# Patient Record
Sex: Female | Born: 1965 | Race: White | Hispanic: No | Marital: Married | Smoking: Never smoker
Health system: Southern US, Community
[De-identification: ages and names within clinical notes are randomized; demographics above are authoritative.]

## PROBLEM LIST (undated history)

## (undated) DIAGNOSIS — B019 Varicella without complication: Secondary | ICD-10-CM

## (undated) HISTORY — DX: Varicella without complication: B01.9

---

## 1998-05-30 ENCOUNTER — Other Ambulatory Visit: Admission: RE | Admit: 1998-05-30 | Discharge: 1998-05-30 | Payer: Self-pay | Admitting: Internal Medicine

## 1999-04-23 ENCOUNTER — Encounter (INDEPENDENT_AMBULATORY_CARE_PROVIDER_SITE_OTHER): Payer: Self-pay | Admitting: Internal Medicine

## 2001-08-27 ENCOUNTER — Other Ambulatory Visit: Admission: RE | Admit: 2001-08-27 | Discharge: 2001-08-27 | Payer: Self-pay | Admitting: Obstetrics and Gynecology

## 2002-05-05 ENCOUNTER — Other Ambulatory Visit: Admission: RE | Admit: 2002-05-05 | Discharge: 2002-05-05 | Payer: Self-pay | Admitting: Obstetrics and Gynecology

## 2002-12-04 ENCOUNTER — Inpatient Hospital Stay (HOSPITAL_COMMUNITY): Admission: AD | Admit: 2002-12-04 | Discharge: 2002-12-08 | Payer: Self-pay | Admitting: Obstetrics and Gynecology

## 2003-01-26 ENCOUNTER — Other Ambulatory Visit: Admission: RE | Admit: 2003-01-26 | Discharge: 2003-01-26 | Payer: Self-pay | Admitting: Obstetrics and Gynecology

## 2004-02-10 ENCOUNTER — Other Ambulatory Visit: Admission: RE | Admit: 2004-02-10 | Discharge: 2004-02-10 | Payer: Self-pay | Admitting: Obstetrics and Gynecology

## 2004-10-09 ENCOUNTER — Ambulatory Visit: Payer: Self-pay | Admitting: Family Medicine

## 2004-12-06 ENCOUNTER — Ambulatory Visit: Payer: Self-pay | Admitting: Family Medicine

## 2005-07-19 ENCOUNTER — Encounter (INDEPENDENT_AMBULATORY_CARE_PROVIDER_SITE_OTHER): Payer: Self-pay | Admitting: Specialist

## 2005-07-19 ENCOUNTER — Ambulatory Visit: Payer: Self-pay | Admitting: Family Medicine

## 2005-07-19 ENCOUNTER — Inpatient Hospital Stay (HOSPITAL_COMMUNITY): Admission: RE | Admit: 2005-07-19 | Discharge: 2005-07-22 | Payer: Self-pay | Admitting: *Deleted

## 2005-09-30 ENCOUNTER — Ambulatory Visit: Payer: Self-pay | Admitting: Family Medicine

## 2005-10-21 ENCOUNTER — Emergency Department: Payer: Self-pay | Admitting: Emergency Medicine

## 2006-12-23 ENCOUNTER — Ambulatory Visit: Payer: Self-pay | Admitting: Family Medicine

## 2008-02-16 ENCOUNTER — Ambulatory Visit: Payer: Self-pay | Admitting: Family Medicine

## 2008-02-16 ENCOUNTER — Encounter (INDEPENDENT_AMBULATORY_CARE_PROVIDER_SITE_OTHER): Payer: Self-pay | Admitting: Internal Medicine

## 2008-02-23 LAB — CONVERTED CEMR LAB
Hepatitis B-Post: 601 milliintl units/mL
Varicella IgG: 3.74 — ABNORMAL HIGH

## 2008-03-01 ENCOUNTER — Ambulatory Visit: Payer: Self-pay | Admitting: Family Medicine

## 2008-03-03 ENCOUNTER — Encounter (INDEPENDENT_AMBULATORY_CARE_PROVIDER_SITE_OTHER): Payer: Self-pay | Admitting: Internal Medicine

## 2008-03-10 ENCOUNTER — Encounter (INDEPENDENT_AMBULATORY_CARE_PROVIDER_SITE_OTHER): Payer: Self-pay | Admitting: Internal Medicine

## 2008-08-18 ENCOUNTER — Ambulatory Visit: Payer: Self-pay | Admitting: Family Medicine

## 2008-08-18 DIAGNOSIS — B369 Superficial mycosis, unspecified: Secondary | ICD-10-CM

## 2008-11-11 ENCOUNTER — Telehealth (INDEPENDENT_AMBULATORY_CARE_PROVIDER_SITE_OTHER): Payer: Self-pay | Admitting: Internal Medicine

## 2009-03-21 ENCOUNTER — Ambulatory Visit: Payer: Self-pay | Admitting: Family Medicine

## 2009-03-23 ENCOUNTER — Encounter (INDEPENDENT_AMBULATORY_CARE_PROVIDER_SITE_OTHER): Payer: Self-pay | Admitting: Internal Medicine

## 2009-05-23 ENCOUNTER — Ambulatory Visit: Payer: Self-pay | Admitting: Internal Medicine

## 2009-05-23 DIAGNOSIS — J069 Acute upper respiratory infection, unspecified: Secondary | ICD-10-CM | POA: Insufficient documentation

## 2009-10-18 ENCOUNTER — Telehealth: Payer: Self-pay | Admitting: Family Medicine

## 2009-11-16 ENCOUNTER — Ambulatory Visit: Payer: Self-pay | Admitting: Family Medicine

## 2009-11-16 DIAGNOSIS — T148XXA Other injury of unspecified body region, initial encounter: Secondary | ICD-10-CM | POA: Insufficient documentation

## 2010-10-09 NOTE — Assessment & Plan Note (Signed)
Summary: FELL AND PAIN IN KNEE/DLO   Vital Signs:  Patient profile:   45 year old female Height:      65 inches Weight:      140.4 pounds BMI:     23.45 Temp:     97.8 degrees F oral Pulse rate:   80 / minute Pulse rhythm:   regular BP sitting:   100 / 60  (left arm) Cuff size:   regular  Vitals Entered By: Benny Lennert CMA Duncan Dull) (November 16, 2009 12:07 PM)  History of Present Illness: Chief complaint fell and pain in left leg and knee  45 year old patient who presents for acute fall:  Coming down stairs in the middle of the night and turned and hit the bottom of her heel, left side of face and her side of leg.  Happened about two weeks ago, laid with her leg propped up. Did not really take anything and took some alleve. Hobbled aroud.   Stayed using the elevator and evertying.   Left side, continued bruising and pain, primarily laterally on the left side, and some in the posterior aspect of her thigh.  REVIEW OF SYSTEMS  GEN: No systemic complaints, no fevers, chills, sweats, or other acute illnesses MSK: Detailed in the HPI GI: tolerating PO intake without difficulty Neuro: No numbness, parasthesias, or tingling associated. Otherwise the pertinent positives of the ROS are noted above.    GEN: Well-developed,well-nourished,in no acute distress; alert,appropriate and cooperative throughout examination HEENT: Normocephalic and atraumatic without obvious abnormalities. No apparent alopecia or balding. Ears, externally no deformities PULM: Breathing comfortably in no respiratory distress EXT: No clubbing, cyanosis, or edema PSYCH: Normally interactive. Cooperative during the interview. Pleasant. Friendly and conversant. Not anxious or depressed appearing. Normal, full affect.   bilateral hips, full range of motion. Nontender.  Nontender the greater trochanteric bursa. Stable pelvis with manipulation.  Full range of motion at the knee and nontender grossly  bilaterally.  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  Consistent with hematoma, left lateral thigh, observation only is appropriate. Discussed hematoma and bruising with the patient.  Complete Medication List: 1)  Daily Multiple Vitamins Tabs (Multiple vitamin) .... Take 1 tablet by mouth once a day 2)  Calcium 600/vitamin D 600-400 Mg-unit Tabs (Calcium carbonate-vitamin d) .... Take 1 tablet by mouth once a day 3)  Eql Fish Oil 1000 Mg Caps (Omega-3 fatty acids) .... Take 1 capsule by mouth two times a day  Current Allergies (reviewed today): No known allergies

## 2010-10-09 NOTE — Progress Notes (Signed)
Summary: nausea and diarrhea  Phone Note Call from Patient Call back at [facility] Phone [phone]   Caller: Patient Summary of Call: Pt complains of vomiting and diarrhea, since this morning.  No fever.  Sxs are not severe.  Advised small sips of clear fluids, rest, call back tomorrow if not better. Initial call taken by: Lowella Petties CMA,  October 18, 2009 3:08 PM  Follow-up for Phone Call        Agreed. Follow-up by: Ruthe Mannan MD,  October 18, 2009 3:19 PM

## 2011-01-04 ENCOUNTER — Telehealth: Payer: Self-pay | Admitting: *Deleted

## 2011-01-04 NOTE — Telephone Encounter (Signed)
Pt states she has had vomiting and diarrhea for 2 days.  She is asking what she can do.  Advised to take frequent small sips of clear fluids, eat ice chips, avoid dairy products.  Avoid solid foods if vomiting.  Can try BRAT diet after vomiting stops, if diarrhea continues.  Rest, wash hands frequently.  She has no other symptoms- no fever or severe abd pain.

## 2011-01-04 NOTE — Telephone Encounter (Signed)
agree

## 2011-01-25 NOTE — Op Note (Signed)
[name], [name]                          ACCOUNT NO.:  [id number]   MEDICAL RECORD NO.:  [id number]                   PATIENT TYPE:  INP   LOCATION:  9171                                 FACILITY:  [facility]   PHYSICIAN:  [place] B. Earlene Plater, M.D.               DATE OF BIRTH:  1966/08/01   DATE OF PROCEDURE:  12/05/2002  DATE OF DISCHARGE:                                 OPERATIVE REPORT   PREOPERATIVE DIAGNOSES:  1. Unsuccessful trial of vacuum and failure to descend.  2. Occipitoposterior position.   POSTOPERATIVE DIAGNOSES:  1. Unsuccessful trial of vacuum and failure to descend.  2. Occipitoposterior position.   PROCEDURE:  Primary low transverse cesarean section.   SURGEON:  Chester Holstein. Earlene Plater, M.D.   ANESTHESIA:  Epidural.   FINDINGS:  Viable female infant, right occipitoposterior position.  Apgars 9  and 9.  Normal uterus, tubes and ovaries.  Subsequently developed uterine  atony, treated with Hemabate x, Methergine x1 and 40 milliunits of Pitocin  infused through the IV.  The infant's weight was 7 pounds 10 ounces.   ESTIMATED BLOOD LOSS:  1000 mL.   FLUIDS:  2200 mL.   URINE OUTPUT:  50 mL.   DRAINS:  Foley.   INDICATIONS:  The patient presented with spontaneous rupture of membranes at  term.  She was contracting spontaneously but did not labor on her own.  She  was subsequently augmented with Pitocin and did progress to complete.  She  pushed for just greater than three hours and would not descend beyond +3  station.  A trial of vacuum was unsuccessful, and the patient subsequently  presents for cesarean section.   The vacuum used was the [facility].  It was placed on the flexion point just  anterior to the posterior fontanel in the mid green zone for three pulls.  There was no descent, and there were no pop offs.  I therefore recommended  cesarean section.   DESCRIPTION OF PROCEDURE:  The patient was taken to the operating room with  epidural anesthesia in place.   She was prepped and draped in the standard  fashion.  The bladder was being drained with the Foley catheter.   A Pfannenstiel incision was made with a knife and carried sharply to the  underlying fascia.  The fascia was divided in the midline and extended  bilaterally with the Mayo scissors.  Kocher clamps were used to elevate the  superior aspect of the incision and the underlying rectus muscles were  dissected off sharply superiorly and inferiorly in a similar fashion.  The  midline of the rectus muscles was identified and posterior sheath elevated  and entered sharply.   The bladder blade was inserted.  The vesicouterine peritoneum was  identified, elevated and bladder flap created with sharp and blunt  technique.  The bladder blade was reinserted and the uterine incision made  in a low  transverse fashion with a knife.  Clear fluid noted at amniotomy.   The infant's head was delivered up through the incision.  Nose and mouth  were suctioned with the bulb, and the remainder of the infant delivered.  It  was necessary to dislodge the anterior arm prior to delivery of the  remainder of the infant much like the shoulder dystocia at a vaginal  delivery.   The cord was clamped and cut, and the infant handed off to the awaiting  pediatricians.  The placenta was removed manually.  The uterus was  exteriorized and cleared of all clots and debris.  The uterine incision was  inspected, and there was a slight extension at the left lateral aspect that  was well visualized.  The extent of the incision was identified and closed  in a running locked stitch of 0 Monocryl.  A second layer was placed in an  imbricating fashion with the same suture.  Bleeders were noted at each  corner and made hemostatic with interrupted figure-of-eight sutures of 0  Monocryl.   Moderate uterine atony was encountered.  This was treated with uterine  massage, 40 milliunits of Pitocin by rapid infusion IV, two doses  of  intrauterine Hemabate and one dose of intramuscular Methergine.  After this,  the uterine tone was improved, and being firm enough by me to leave the  operating room.  The patient will be treated with Methergine p.o. for 24  hours after surgery.   The patient tolerated the procedure well and other than postpartum uterine  atony, there were no complications.  She was taken to the operating room in  stable condition.  All counts were correct per the operating room staff.                                               Gerri Spore B. Earlene Plater, M.D.    WBD/MEDQ  D:  12/05/2002  T:  12/05/2002  Job:  [id number]

## 2011-01-25 NOTE — Op Note (Signed)
NAME:  [name], [name]              ACCOUNT NO.:  [id number]   MEDICAL RECORD NO.:  [id number]          PATIENT TYPE:  INP   LOCATION:  9372                          FACILITY:  [facility]   PHYSICIAN:  [place] B. Earlene Plater, M.D.  DATE OF BIRTH:  03-11-1966   DATE OF PROCEDURE:  07/19/2005  DATE OF DISCHARGE:                                 OPERATIVE REPORT   PREOPERATIVE DIAGNOSES:  1.  Thirty-seven week intrauterine pregnancy.  2.  Breech presentation.  3.  Mild preeclampsia.  4.  Previous cesarean section.   POSTOPERATIVE DIAGNOSES:  1.  Thirty-seven week intrauterine pregnancy.  2.  Breech presentation.  3.  Mild preeclampsia.  4.  Previous cesarean section.  5.  Placenta accreta.   PROCEDURE:  Repeat low transverse cesarean section with manual extraction  and curettage of placenta accreta and B-Lynch suturing due to uterine atony.   SURGEON:  Chester Holstein. Earlene Plater, M.D.   ASSISTANT:  Shelbie Proctor. Shawnie Pons, M.D.   ANESTHESIA:  Spinal.   SPECIMENS:  Placenta.   ESTIMATED BLOOD LOSS:  3000.   FLUIDS:  5 L crystalloid and two units of packed red cells.   COMPLICATIONS:  Placenta accreta, which was removed manually and with  curettage.  B-Lynch sutures were placed to recompress the uterus and reduce  bleeding.   INDICATIONS:  Patient with above issues with 2 g of protein on 24-hour urine  from yesterday.  Blood pressure has been 140s/90s, and she had been  asymptomatic other than abrupt increase in swelling in the last few days.  Given her gestational age, breech presentation and previous C-section, I  recommended repeat C-section at this point.  Ultrasound had given no  indication for abnormal placentation, and her prenatal care had been  otherwise uncomplicated.   PROCEDURE:  Patient taken to the operating room and spinal anesthesia  obtained.  She was prepped and draped in standard fashion and a Foley  catheter inserted into the bladder.  A Pfannenstiel made with a knife and  carried  sharply to the fascia.  The fascia was divided sharply and the  underlying rectus muscles were dissected off sharply.  The posterior sheath  and peritoneum were entered sharply, bladder flap created with sharp and  blunt technique.  A few subserosal vessels were noted in the fundal region,  although otherwise the fundus appeared normal as did the area of the bladder  flap.   The bladder flap was created with sharp and blunt technique, the bladder  blade inserted, and the uterine incision made in a low transverse fashion  with a knife.  Clear fluid at amniotomy.   The breech was elevated through the incision and delivered sacrum anterior.  The legs were delivered by flexion of the hips.  Traction placed on the  iliac crests and delivered to the level of the scapulae.  Each arm was  delivered by rotation of the torso and flexion at the elbow.  Head delivered  by flexion of the neck, nose and mouth suctioned with a bulb, cord clamped  and cut, and infant handed off to awaiting pediatricians.  Ancef 1 g given  at cord clamp.   Attempt was made to remove the placenta by uterine massage; however, no  success with this approach.  Manual extraction attempted and abnormal  attachment to the posterior wall and fundus of the uterus was encountered.  Placenta accreta diagnosed.  Additional anesthesia support and a surgical  assist requested and immediately obtained.   With additional effort, the placenta could be removed in pieces with the  assistance of the ring forceps and ultimately of the banjo curette.  Significant bleeding occured during this time from the placental area.  It  appeared that all of the placental tissue was removed.  At the fundus the  uterine thickness was essentially paper thin.  The uterus was also atonic  despite massage and intravenous Pitocin in the IV bag.  Therefore, I decided  to place B-Lynch sutures to compress the uterus and reduce bleeding.  This  had a  remarkable effect.  The bleeding was essentially stopped with  placement of several B-Lynch sutures of 0 chromic.  The uterine incision was  closed in a running locked stitch of 0 chromic and a second imbricating  layer placed for hemostasis.  It was noted at this point that all suture  points were oozing.  We had already begun giving packed cells and sent a DIC  panel and requested FFP and platelets, which were being processed at the  time.  Avitene was placed over the bleeding suture points with hemostasis  obtained.  The drape was lifted and the patient froglegged, the uterus  compressed and some clot expressed but no additional bleeding found.  Therefore, the uterus was reinserted and the fascia closed in a running  stitch of 0 Vicryl.  The subcutaneous tissue was irrigated and made  hemostatic with the Bovie and the skin closed with staples.  The patient was  again froglegged and no additional substantial bleeding was encountered with  uterine massage.  The fundus was noted to be at least 2 cm below the  umbilicus at this point.  In addition, the patient was stable and therefore  deemed appropriate to take to the recovery room.  It was explained to the  patient intraoperatively  real-time as she was awake the ongoing situation  and the potential need for hysterectomy, which appears to be averted by  placement of the B-Lynch sutures; however, we will have to continue to  monitor her closely for bleeding.  In addition, she will need FFP, which has  been ordered and will be administered upon arrival.   The patient tolerated the procedure well and otherwise there were no  complications.  She was taken to the recovery room in stable condition.  All  counts correct per the operating room staff.      Gerri Spore B. Earlene Plater, M.D.  Electronically Signed     WBD/MEDQ  D:  07/19/2005  T:  07/20/2005  Job:  [id number]

## 2011-01-25 NOTE — Discharge Summary (Signed)
NAME:  [name], [name]              ACCOUNT NO.:  [id number]   MEDICAL RECORD NO.:  [id number]          PATIENT TYPE:  INP   LOCATION:  9120                          FACILITY:  [facility]   PHYSICIAN:  [place] B. Earlene Plater, M.D.  DATE OF BIRTH:  Oct 04, 1965   DATE OF ADMISSION:  07/19/2005  DATE OF DISCHARGE:  07/22/2005                                 DISCHARGE SUMMARY   ADMITTING DIAGNOSES:  1.  A 37-week intrauterine pregnancy.  2.  Breech presentation.  3.  Mild preeclampsia.  4.  Previous cesarean section.   DISCHARGE DIAGNOSES:  1.  A 37-week intrauterine pregnancy.  2.  Breech presentation.  3.  Mild preeclampsia.  4.  Previous cesarean section.  5.  Placenta accreta.   PROCEDURE:  Repeat low transverse cesarean section with manual extraction  and curettage of placenta accreta and B-Lynch suturing due to uterine atony.   HISTORY OF PRESENT ILLNESS:  A 45 year old white female gravida 2, para 1  37+ weeks with 2 g of protein on a 24-hour urine with blood pressures in the  140s/90s with increasing extremity swelling.  Given her gestational age and  previous cesarean section I recommended a repeat.  Ultrasound had given no  indication for abnormal presentation.   HOSPITAL COURSE:  Patient was admitted and repeat low transverse cesarean  section performed without difficulty.  However, at attempt to remove the  placenta it was noted to be abnormally implanted consistent with placenta  accreta.  With additional effort ultimately placenta was removed manually  and with uterine curettage, however, with significant uterine atony and  bleeding as a result.  This was treated with B-Lynch suturing.  Given the  substantial blood loss patient did require 4 units of packed red blood cells  and 2 units of fresh frozen plasma as she did demonstrate clinical evidence  of DIC intraoperatively.   Postoperatively patient rapidly regained her ability to ambulate, void, and  tolerate a regular diet.   She remained hemodynamically stable and her  postoperative hemoglobin settled at about 9.  No additional blood products  were necessary.  She was discharged to home on the third postoperative day  in satisfactory condition.  It was emphasized to the patient the severe  nature of the bleeding which was encountered and the recommendation made  that no further children be attempted.  Patient had not been counseled for  tubal ligation preoperatively, therefore, it was not performed  intraoperatively as the main focus was preservation of life and cessation of  bleeding.  However, patient is considering her options in this regard for  future birth control.   DISCHARGE INSTRUCTIONS:  Standard pre-printed instructions given prior to  dismissal.   DISCHARGE MEDICATIONS:  1.  Tylox one to two p.o. q.4h. for significant pain.  2.  Ferrous sulfate 325 mg p.o. b.i.d.   FOLLOW-UP:  [facility] Dr. Earlene Plater one month.      Gerri Spore B. Earlene Plater, M.D.  Electronically Signed     WBD/MEDQ  D:  08/15/2005  T:  08/15/2005  Job:  [id number]

## 2011-01-25 NOTE — Discharge Summary (Signed)
[name], [name]                          ACCOUNT NO.:  [id number]   MEDICAL RECORD NO.:  [id number]                   PATIENT TYPE:  INP   LOCATION:  9128                                 [facility]ACILITY[facility]   PHYSICIAN:  [place] B. Earlene Plater, M.D.               DATE OF BIRTH:  [birth year]   DATE OF ADMISSION:  12/04/2002  DATE OF DISCHARGE:  12/08/2002                                 DISCHARGE SUMMARY   ADMISSION DIAGNOSIS:  1. Spontaneous ruptured membranes at term.  2. Failure to descend.  3. Unsuccessful trial of vacuum-assisted delivery.  4. Occipita posterior position.   PROCEDURES:  1. Admission for management of labor.  2. Attempted at vacuum-assisted delivery for failure to descend.  3. Primary low-transverse cesarean section for unsuccessful trial vacuum.   HOSPITAL COURSE:  For complete details please see the history and physical  in the chart.  Briefly the patient presented, a 45 year old white female  gravida 1, para 0, 43-3/7ths weeks with spontaneous ruptured membranes.  Pitocin augmentation was necessary.  Patient progressed to complete and  pushed for about three hours.  Was found to be complete  +3 right occipita  posterior position.  Was offered for a second time a trial with vacuum but  wanted to keep pushing.  Ultimately pushed for three hours and 50 minutes.  Vacuum attempt was unsuccessful as there was no descent on +3 with 3 pulse  without any pop-offs with the [facility] device.  Therefore the patient was  delivered by a primary cesarean section.   Findings at the time of surgery included viable female with Apgars 9 and 9.  Normal  uterus, tubes and ovaries.  Weight was 7 pounds 10 ounces.  Moderate  uterine atony was encountered.  This was managed with Methergine, Hemabate  and Pitocin.  The patient was also given a Cytotec suppository 400 mcg per  rectum in the PACU to maintain good uterine tone.  She was also given p.o.  Methergine for 24 hours for the same.   She was monitored overnight in the  AICU to closely follow her vital signs.  Her hemoglobin stabilized at 8.4  postpartum and the patient was without symptoms from the mild to moderate  anemia.   By the third postoperative day the patient was ambulating, voiding and  tolerating a regular diet.  She had no symptoms from the anemia and was  discharged home in satisfactory condition.   DISCHARGE MEDICATIONS:  1. Ferrous sulfate 325 mg p.o. daily.  2. Prenatal vitamins p.o. daily.  3. Tylox 1-2 tabs every four to six hours as needed for pain.    FOLLOW UP:  Follow up with [facility] in four to six weeks.   DISCHARGE INSTRUCTIONS:  Standard preprinted instructions were given prior  to dismissal.  Gerri Spore B. Earlene Plater, M.D.    WBD/MEDQ  D:  12/28/2002  T:  12/28/2002  Job:  [id number]

## 2011-10-29 ENCOUNTER — Encounter: Payer: Self-pay | Admitting: Family Medicine

## 2011-10-30 ENCOUNTER — Ambulatory Visit (INDEPENDENT_AMBULATORY_CARE_PROVIDER_SITE_OTHER): Payer: 59 | Admitting: Family Medicine

## 2011-10-30 ENCOUNTER — Encounter: Payer: Self-pay | Admitting: Family Medicine

## 2011-10-30 VITALS — BP 98/68 | HR 76 | Temp 97.7°F | Ht 65.0 in | Wt 142.2 lb

## 2011-10-30 DIAGNOSIS — J069 Acute upper respiratory infection, unspecified: Secondary | ICD-10-CM | POA: Insufficient documentation

## 2011-10-30 MED ORDER — GUAIFENESIN-CODEINE 100-10 MG/5ML PO SYRP: 5.0000 mL | ORAL_SOLUTION | Freq: Three times a day (TID) | ORAL | Status: AC | PRN

## 2011-10-30 MED ORDER — BENZONATATE 200 MG PO CAPS: 200.0000 mg | ORAL_CAPSULE | Freq: Three times a day (TID) | ORAL | Status: AC | PRN

## 2011-10-30 NOTE — Progress Notes (Signed)
  Subjective:    Patient ID: [name], female    DOB: 12-03-1965, 46 y.o.   MRN: [id number]  HPI Here for uri symptoms  Cough for at least a week -- started productive , now dry Was clear mucous Is lingering  (problematic - dental hygienist )  Is worse at night - keeps her up  Also some crust in her eyes when she wakes up (no redness or pain )  Post nasal drainage  Some nasal congestion Some sore throat Ears are fine   No fever   Patient Active Problem List  Diagnoses  . FUNGAL DERMATITIS  . CONTUSION OF UNSPECIFIED SITE  . Viral URI with cough   No past medical history on file. Past Surgical History  Procedure Date  . Cesarean section     x 2   History  Substance Use Topics  . Smoking status: Never Smoker   . Smokeless tobacco: Not on file  . Alcohol Use: Not on file   No family history on file. No Known Allergies Current Outpatient Prescriptions on File Prior to Visit  Medication Sig Dispense Refill  . Multiple Vitamin (MULTIVITAMIN) tablet Take 1 tablet by mouth daily.      . Omega-3 Fatty Acids (FISH OIL) 1000 MG CAPS Take by mouth 2 (two) times daily.      . Calcium Carbonate-Vitamin D (SM CALCIUM 600/VITAMIN D) 600-400 MG-UNIT per tablet Take 1 tablet by mouth daily.          Review of Systems Review of Systems  Constitutional: Negative for fever, appetite change, and unexpected weight change. pos for fatigue  Eyes: Negative for pain and visual disturbance.  ENT neg for sinus pain or epistaxis Respiratory: Negative for sob or wheeze  Cardiovascular: Negative for cp or palpitations    Gastrointestinal: Negative for nausea, diarrhea and constipation.  Genitourinary: Negative for urgency and frequency.  Skin: Negative for pallor or rash   Neurological: Negative for weakness, light-headedness, numbness and headaches.  Hematological: Negative for adenopathy. Does not bruise/bleed easily.  Psychiatric/Behavioral: Negative for dysphoric mood. The  patient is not nervous/anxious.          Objective:   Physical Exam  Constitutional: She appears well-developed and well-nourished. No distress.  HENT:  Head: Normocephalic and atraumatic.  Right Ear: External ear normal.  Left Ear: External ear normal.  Mouth/Throat: Oropharynx is clear and moist. No oropharyngeal exudate.       Nares are injected and congested  No sinus tenderness  Eyes: Conjunctivae and EOM are normal. Pupils are equal, round, and reactive to light. Right eye exhibits no discharge. Left eye exhibits no discharge.  Neck: Normal range of motion. Neck supple.  Cardiovascular: Normal rate and regular rhythm.   Pulmonary/Chest: Effort normal and breath sounds normal. No respiratory distress. She has no wheezes. She has no rales. She exhibits no tenderness.  Lymphadenopathy:    She has no cervical adenopathy.  Skin: Skin is warm and dry. No rash noted.  Psychiatric: She has a normal mood and affect.          Assessment & Plan:

## 2011-10-30 NOTE — Patient Instructions (Signed)
I think you have a viral upper resp infection with cough  Try the tessalon during the day and the robitussin with codeine for night  Drink lots of fluids and get extra rest Update if not starting to improve in a week or if worsening

## 2011-10-30 NOTE — Assessment & Plan Note (Signed)
Dry cough / clear mucous drainage and no fever Will tx symptoms Tessalon tid prn for day and robitussin codeine at night Adv fluids/ rest Update if not starting to improve in a week or if worsening

## 2011-11-27 ENCOUNTER — Ambulatory Visit: Payer: 59 | Admitting: Family Medicine

## 2012-01-09 ENCOUNTER — Ambulatory Visit (INDEPENDENT_AMBULATORY_CARE_PROVIDER_SITE_OTHER): Payer: 59 | Admitting: Family Medicine

## 2012-01-09 ENCOUNTER — Encounter: Payer: Self-pay | Admitting: Family Medicine

## 2012-01-09 VITALS — BP 100/62 | HR 80 | Temp 98.4°F | Ht 65.25 in | Wt 142.0 lb

## 2012-01-09 DIAGNOSIS — R5381 Other malaise: Secondary | ICD-10-CM

## 2012-01-09 DIAGNOSIS — Z0000 Encounter for general adult medical examination without abnormal findings: Secondary | ICD-10-CM

## 2012-01-09 DIAGNOSIS — R197 Diarrhea, unspecified: Secondary | ICD-10-CM

## 2012-01-09 DIAGNOSIS — R5383 Other fatigue: Secondary | ICD-10-CM

## 2012-01-09 DIAGNOSIS — Z136 Encounter for screening for cardiovascular disorders: Secondary | ICD-10-CM

## 2012-01-09 LAB — CBC WITH DIFFERENTIAL/PLATELET
Basophils Absolute: 0 10*3/uL (ref 0.0–0.1)
HCT: 38.3 % (ref 36.0–46.0)
Lymphs Abs: 1.6 10*3/uL (ref 0.7–4.0)
MCV: 91.5 fl (ref 78.0–100.0)
Monocytes Absolute: 0.3 10*3/uL (ref 0.1–1.0)
Neutrophils Relative %: 61.7 % (ref 43.0–77.0)
Platelets: 203 10*3/uL (ref 150.0–400.0)
RDW: 13 % (ref 11.5–14.6)

## 2012-01-09 LAB — LIPID PANEL
HDL: 48.9 mg/dL (ref >39.00)
Total CHOL/HDL Ratio: 4
Triglycerides: 112 mg/dL (ref 0.0–149.0)
VLDL: 22.4 mg/dL (ref 0.0–40.0)

## 2012-01-09 LAB — COMPREHENSIVE METABOLIC PANEL
ALT: 14 U/L (ref 0–35)
AST: 16 U/L (ref 0–37)
Alkaline Phosphatase: 43 U/L (ref 39–117)
Creatinine, Ser: 1 mg/dL (ref 0.4–1.2)
Total Bilirubin: 0.7 mg/dL (ref 0.3–1.2)

## 2012-01-09 LAB — T4, FREE: Free T4: 0.89 ng/dL (ref 0.60–1.60)

## 2012-01-09 NOTE — Progress Notes (Signed)
Subjective:    Patient ID: [name], female    DOB: 08-01-1966, 46 y.o.   MRN: [id number]  HPI  46 yo pt new to me here for CPX.  G2P2- no h/o abnormal pap smears, has GYN ([facility])- UTD on pap and mammogram.  Fatigue- feels like she is more tired lately.  Works full time and has two kids and manages to exercise and go to all of her kids activities.  Feels like her fatigue is "due to life" but wanted to make sure it was nothing more serious.  Sister has a h/o thyroid dysfunction. No CP or SOB. No blood in stool. No dizziness. No HA or blurred vision. Denies any symptoms of hypo or hyper thyroidism. Denies any symptoms of anxiety and or depression.  Diarrhea- past few months, has loose stools and mild abdominal cramping after every meal, worsened by spicy foods.  No abdominal pain other than mild cramping. No n/v. No fevers. No blood or mucous in stool. Does not seem to be worsened specifically by dairy foods.  Patient Active Problem List  Diagnoses  . FUNGAL DERMATITIS  . CONTUSION OF UNSPECIFIED SITE  . Viral URI with cough  . Routine general medical examination at a health care facility  . Fatigue  . Diarrhea   No past medical history on file. Past Surgical History  Procedure Date  . Cesarean section     x 2   History  Substance Use Topics  . Smoking status: Never Smoker   . Smokeless tobacco: Not on file  . Alcohol Use: Not on file   Family History  Problem Relation Age of Onset  . Thyroid disease Father    No Known Allergies Current Outpatient Prescriptions on File Prior to Visit  Medication Sig Dispense Refill  . Calcium Carbonate-Vitamin D (SM CALCIUM 600/VITAMIN D) 600-400 MG-UNIT per tablet Take 1 tablet by mouth daily.      . Multiple Vitamin (MULTIVITAMIN) tablet Take 1 tablet by mouth daily.      . Omega-3 Fatty Acids (FISH OIL) 1000 MG CAPS Take by mouth 2 (two) times daily.      Marland Kitchen Phenylephrine-DM-GG (TUSSIN CF PO) Take by mouth as  directed.       The PMH, PSH, Social History, Family History, Medications, and allergies have been reviewed in [facility], and have been updated if relevant.    The PMH, PSH, Social History, Family History, Medications, and allergies have been reviewed in [facility], and have been updated if relevant.   Review of Systems    See HPI Patient reports no  vision/ hearing changes,anorexia, weight change, fever ,adenopathy, persistant / recurrent hoarseness, swallowing issues, chest pain, edema,persistant / recurrent cough, hemoptysis, dyspnea(rest, exertional, paroxysmal nocturnal), gastrointestinal  bleeding (melena, rectal bleeding), abdominal pain, excessive heart burn, GU symptoms(dysuria, hematuria, pyuria, voiding/incontinence  Issues) syncope, focal weakness, severe memory loss, concerning skin lesions, depression, anxiety, abnormal bruising/bleeding, major joint swelling, breast masses or abnormal vaginal bleeding.    Objective:   Physical Exam BP 100/62  Pulse 80  Temp(Src) 98.4 F (36.9 C) (Oral)  Ht 5' 5.25" (1.657 m)  Wt 142 lb (64.411 kg)  BMI 23.45 kg/m2  LMP 12/23/2011  General:  Well-developed,well-nourished,in no acute distress; alert,appropriate and cooperative throughout examination Head:  normocephalic and atraumatic.   Eyes:  vision grossly intact, pupils equal, pupils round, and pupils reactive to light.   Ears:  R ear normal and L ear normal.   Nose:  no external deformity.  Mouth:  good dentition.   Neck:  No deformities, masses, or tenderness noted. Lungs:  Normal respiratory effort, chest expands symmetrically. Lungs are clear to auscultation, no crackles or wheezes. Heart:  Normal rate and regular rhythm. S1 and S2 normal without gallop, murmur, click, rub or other extra sounds. Abdomen:  Bowel sounds positive,abdomen soft and non-tender without masses, organomegaly or hernias noted. Msk:  No deformity or scoliosis noted of thoracic or lumbar spine.   Extremities:  No  clubbing, cyanosis, edema, or deformity noted with normal full range of motion of all joints.   Neurologic:  alert & oriented X3 and gait normal.   Skin:  Intact without suspicious lesions or rashes Psych:  Cognition and judgment appear intact. Alert and cooperative with normal attention span and concentration. No apparent delusions, illusions, hallucinations     Assessment & Plan:   1. Routine general medical examination at a health care facility  Reviewed preventive care protocols, scheduled due services, and updated immunizations Discussed nutrition, exercise, diet, and healthy lifestyle.  Comprehensive metabolic panel Lipid Panel  2. Fatigue  New- likely due to busy lifestyle but will check labs to rule out other possible contributing factors. The patient indicates understanding of these issues and agrees with the plan.  TSH + free T4, CBC with Differential  3. Diarrhea  New- no red flag symptoms. Sounds consistent with IBS.  Will try conservative management first- see pt instructions for complete details. She will call me in 2 weeks with an update.

## 2012-01-09 NOTE — Patient Instructions (Signed)
It was great to meet you.  Increase fiber and water, and try over the counter gas-x or beano for bloating.  pepcid 20 mg daily for next 2 weeks. Exclude gas producing foods (beans, onions, celery, carrots, raisins, bananas, apricots, prunes, brussel sprouts, wheat germ, pretzels)  Consider trail of lactose free diet (back off milk)  Trial of align for bloating/IBS symptoms (OTC).

## 2014-12-09 ENCOUNTER — Ambulatory Visit (INDEPENDENT_AMBULATORY_CARE_PROVIDER_SITE_OTHER): Payer: Managed Care, Other (non HMO) | Admitting: Family Medicine

## 2014-12-09 ENCOUNTER — Encounter: Payer: Self-pay | Admitting: Family Medicine

## 2014-12-09 VITALS — BP 102/60 | HR 62 | Temp 98.0°F | Resp 18 | Ht 66.0 in | Wt 141.0 lb

## 2014-12-09 DIAGNOSIS — R21 Rash and other nonspecific skin eruption: Secondary | ICD-10-CM | POA: Diagnosis not present

## 2014-12-09 MED ORDER — MUPIROCIN CALCIUM 2 % EX CREA: 1.0000 "application " | TOPICAL_CREAM | Freq: Two times a day (BID) | CUTANEOUS | Status: DC

## 2014-12-09 NOTE — Progress Notes (Signed)
   Subjective:    Patient ID: [name], female    DOB: 01/15/1966, 49 y.o.   MRN: [id number]  HPI  49 year old female with new onset rash in bikini line x 1 week . Occured after shaving and going to the beach, was in indoor lazy river. Applied lotion (cocunut oil), but then developed 2-3 blisters. Blisters have resolved, but red sore area of  tissue remains. No discharge. No improvement with neosporin.   No fever, no abdominal pain. No rash otherwise.  Eczema in college none since.  No concern of STDs, married, one partner for years.       Review of Systems  Constitutional: Negative for fever.  HENT: Negative for mouth sores.   Respiratory: Negative for shortness of breath.   Cardiovascular: Negative for chest pain.  Genitourinary: Negative for vaginal discharge and vaginal pain.  Skin: Positive for rash.       No other rash        Objective:   Physical Exam  Constitutional: Vital signs are normal. She appears well-developed and well-nourished. She is cooperative.  Non-toxic appearance. She does not appear ill. No distress.  HENT:  Head: Normocephalic.  Right Ear: Hearing, tympanic membrane, external ear and ear canal normal. Tympanic membrane is not erythematous, not retracted and not bulging.  Left Ear: Hearing, tympanic membrane, external ear and ear canal normal. Tympanic membrane is not erythematous, not retracted and not bulging.  Nose: No mucosal edema or rhinorrhea. Right sinus exhibits no maxillary sinus tenderness and no frontal sinus tenderness. Left sinus exhibits no maxillary sinus tenderness and no frontal sinus tenderness.  Mouth/Throat: Uvula is midline, oropharynx is clear and moist and mucous membranes are normal.  Eyes: Conjunctivae, EOM and lids are normal. Pupils are equal, round, and reactive to light. Lids are everted and swept, no foreign bodies found.  Neck: Trachea normal and normal range of motion. Neck supple. Carotid bruit is not present.  No thyroid mass and no thyromegaly present.  Cardiovascular: Normal rate, regular rhythm, S1 normal, S2 normal, normal heart sounds, intact distal pulses and normal pulses.  Exam reveals no gallop and no friction rub.   No murmur heard. Pulmonary/Chest: Effort normal and breath sounds normal. No tachypnea. No respiratory distress. She has no decreased breath sounds. She has no wheezes. She has no rhonchi. She has no rales.  Abdominal: Soft. Normal appearance and bowel sounds are normal. There is no tenderness.  Genitourinary: No labial fusion. There is no rash, tenderness, lesion or injury on the right labia. There is no rash, tenderness, lesion or injury on the left labia.  Right lower panty line near gluteal crease, ulcer with erythematous rim, no blister, no pustule.  Lymphadenopathy:       Right: No inguinal adenopathy present.       Left: No inguinal adenopathy present.  Neurological: She is alert.  Skin: Skin is warm, dry and intact. No rash noted.  Psychiatric: Her speech is normal and behavior is normal. Judgment and thought content normal. Her mood appears not anxious. Cognition and memory are normal. She does not exhibit a depressed mood.

## 2014-12-09 NOTE — Assessment & Plan Note (Signed)
Will culture for virus to rule out herpes. Pt low risk. Cover for bacterial infection with  Bactroban ointment.

## 2014-12-09 NOTE — Patient Instructions (Signed)
We will call with culture results. Apply antibiotic cream twice daily until resolved. Call sooner if redness spreading or fever.

## 2014-12-09 NOTE — Addendum Note (Signed)
Addended by: Honor LohGARRISON, Lamika Connolly M on: 12/09/2014 12:58 PM   Modules accepted: Orders

## 2014-12-09 NOTE — Progress Notes (Signed)
Pre visit review using our clinic review tool, if applicable. No additional management support is needed unless otherwise documented below in the visit note. 

## 2014-12-12 LAB — HERPES SIMPLEX VIRUS CULTURE: Organism ID, Bacteria: DETECTED

## 2017-01-15 ENCOUNTER — Encounter: Payer: Self-pay | Admitting: Emergency Medicine

## 2017-01-15 ENCOUNTER — Emergency Department
Admission: EM | Admit: 2017-01-15 | Discharge: 2017-01-15 | Disposition: A | Discharge status: Home / Self Care | Payer: No Typology Code available for payment source | Attending: Emergency Medicine | Admitting: Emergency Medicine

## 2017-01-15 ENCOUNTER — Emergency Department: Payer: No Typology Code available for payment source

## 2017-01-15 DIAGNOSIS — R0789 Other chest pain: Secondary | ICD-10-CM | POA: Insufficient documentation

## 2017-01-15 DIAGNOSIS — R079 Chest pain, unspecified: Secondary | ICD-10-CM

## 2017-01-15 DIAGNOSIS — R51 Headache: Secondary | ICD-10-CM | POA: Diagnosis not present

## 2017-01-15 LAB — BASIC METABOLIC PANEL
ANION GAP: 5 (ref 5–15)
BUN: 22 mg/dL — ABNORMAL HIGH (ref 6–20)
CALCIUM: 9.1 mg/dL (ref 8.9–10.3)
CO2: 27 mmol/L (ref 22–32)
Chloride: 107 mmol/L (ref 101–111)
Creatinine, Ser: 0.86 mg/dL (ref 0.44–1.00)
Glucose, Bld: 94 mg/dL (ref 65–99)
Potassium: 4 mmol/L (ref 3.5–5.1)
SODIUM: 139 mmol/L (ref 135–145)

## 2017-01-15 LAB — CBC
HCT: 37.8 % (ref 35.0–47.0)
Hemoglobin: 12.6 g/dL (ref 12.0–16.0)
MCH: 29.8 pg (ref 26.0–34.0)
MCHC: 33.4 g/dL (ref 32.0–36.0)
MCV: 89.1 fL (ref 80.0–100.0)
Platelets: 234 10*3/uL (ref 150–440)
RBC: 4.24 MIL/uL (ref 3.80–5.20)
RDW: 13.4 % (ref 11.5–14.5)
WBC: 5.3 10*3/uL (ref 3.6–11.0)

## 2017-01-15 LAB — TROPONIN I: Troponin I: <0.03 ng/mL (ref <0.03)

## 2017-01-15 NOTE — ED Provider Notes (Signed)
[facility] Emergency Department Provider Note   ____________________________________________   I have reviewed the triage vital signs and the nursing notes.   HISTORY  Chief Complaint Chest Pain   History limited by: Not Limited   HPI [name] is a 51 y.o. female who presents to the emergency department today because of concerns for chest pain, right arm pain and jaw pain. Patient states she has had the jaw pain on and off for a few weeks. She has related to stress. Last night however she started having right arm pain. She describes it as sharp and severe. It woke her from sleep. Again primarily from the elbow to the hand. When she got up this morning she also started having some chest pressure. Located in the center chest. She took some aspirin and it went away however it returned when the patient was at work. Patient denies any smoking or drinking. No family history of heart disease. Patient denies hypertension, diabetes and hyperlipidemia.   History reviewed. No pertinent past medical history.  Patient Active Problem List   Diagnosis Date Noted  . Rash of genital area 12/09/2014  . Routine general medical examination at a health care facility 01/09/2012  . Fatigue 01/09/2012  . Diarrhea 01/09/2012  . Viral URI with cough 10/30/2011  . CONTUSION OF UNSPECIFIED SITE 11/16/2009  . FUNGAL DERMATITIS 08/18/2008    Past Surgical History:  Procedure Laterality Date  . CESAREAN SECTION     x 2    Prior to Admission medications   Medication Sig Start Date End Date Taking? Authorizing Provider  Biotin 2500 MCG CAPS Take 1 capsule by mouth daily.    [provider]  Calcium Carbonate-Vitamin D (SM CALCIUM 600/VITAMIN D) 600-400 MG-UNIT per tablet Take 1 tablet by mouth daily.    [provider]  glucosamine-chondroitin 500-400 MG tablet Take 1 tablet by mouth 3 (three) times daily.    [provider]  magnesium gluconate  (MAGONATE) 500 MG tablet Take 500 mg by mouth 2 (two) times daily.    [provider]  Multiple Vitamin (MULTIVITAMIN) tablet Take 1 tablet by mouth daily.    [provider]  mupirocin cream (BACTROBAN) 2 % Apply 1 application topically 2 (two) times daily. 12/09/14   Bedsole, Amy E, MD  Omega-3 Fatty Acids (FISH OIL) 1000 MG CAPS Take by mouth 2 (two) times daily.    [provider]  Phenylephrine-DM-GG (TUSSIN CF PO) Take by mouth as directed.    [provider]    Allergies Patient has no known allergies.  Family History  Problem Relation Age of Onset  . Thyroid disease Father     Social History Social History  Substance Use Topics  . Smoking status: Never Smoker  . Smokeless tobacco: Never Used  . Alcohol use No    Review of Systems Constitutional: No fever/chills Eyes: No visual changes. ENT: No sore throat. Cardiovascular: Positive for chest pressure. Respiratory: Denies shortness of breath. Gastrointestinal: No abdominal pain.  No nausea, no vomiting.  No diarrhea.   Genitourinary: Negative for dysuria. Musculoskeletal: Positive for right arm pain. Skin: Negative for rash. Neurological: Negative for headaches, focal weakness or numbness.  ____________________________________________   PHYSICAL EXAM:  VITAL SIGNS: ED Triage Vitals  Enc Vitals Group     BP 01/15/17 2030 134/72     Pulse Rate 01/15/17 2030 67     Resp 01/15/17 2030 18     Temp --  Temp Source 01/15/17 2030 Oral     SpO2 01/15/17 2030 100 %     Weight 01/15/17 2027 130 lb (59 kg)     Height 01/15/17 2027 5\' 6"  (1.676 m)     Head Circumference --      Peak Flow --      Pain Score 01/15/17 2026 3   Constitutional: Alert and oriented. Well appearing and in no distress. Eyes: Conjunctivae are normal. Normal extraocular movements. ENT   Head: Normocephalic and atraumatic.   Nose: No congestion/rhinnorhea.   Mouth/Throat: Mucous membranes are  moist.   Neck: No stridor. Hematological/Lymphatic/Immunilogical: No cervical lymphadenopathy. Cardiovascular: Normal rate, regular rhythm.  No murmurs, rubs, or gallops.  Respiratory: Normal respiratory effort without tachypnea nor retractions. Breath sounds are clear and equal bilaterally. No wheezes/rales/rhonchi. Gastrointestinal: Soft and non tender. No rebound. No guarding.  Genitourinary: Deferred Musculoskeletal: Normal range of motion in all extremities. No lower extremity edema. Neurologic:  Normal speech and language. No gross focal neurologic deficits are appreciated.  Skin:  Skin is warm, dry and intact. No rash noted. Psychiatric: Mood and affect are normal. Speech and behavior are normal. Patient exhibits appropriate insight and judgment.  ____________________________________________    LABS (pertinent positives/negatives)  Labs Reviewed  BASIC METABOLIC PANEL - Abnormal; Notable for the following:       Result Value   BUN 22 (*)    All other components within normal limits  CBC  TROPONIN I     ____________________________________________   EKG  I, Phineas SemenGraydon Ketra Duchesne, attending physician, personally viewed and interpreted this EKG  EKG Time: 2027 Rate: 63 Rhythm: normal sinus rhythm Axis: normal Intervals: qtc 425 QRS: narrow ST changes: no st elevation Impression: normal ekg   ____________________________________________    RADIOLOGY  CXR  IMPRESSION: Negative. No active cardiopulmonary disease.   ____________________________________________   PROCEDURES  Procedures  ____________________________________________   INITIAL IMPRESSION / ASSESSMENT AND PLAN / ED COURSE  Pertinent labs & imaging results that were available during my care of the patient were reviewed by me and considered in my medical decision making (see chart for details).  Patient presented to the emergency room emergency department today because of concerns for  chest pressure, right arm pain show pain. Patient is very low risk for cardiac disease given lack of family history, lack of diabetes, high blood pressure or hypercholesterolemia. The patient also is a nonsmoker. Troponin was negative and EKG was normal. This point think patient is very low risk for cardiac disease. Patient is safe to be discharged from a cardiac standpoint point. Will however give patient cardiology follow up information.  ____________________________________________   FINAL CLINICAL IMPRESSION(S) / ED DIAGNOSES  Final diagnoses:  Nonspecific chest pain     Note: This dictation was prepared with Dragon dictation. Any transcriptional errors that result from this process are unintentional     Phineas SemenGoodman, Javarus Dorner, MD 01/15/17 2340

## 2017-01-15 NOTE — ED Triage Notes (Signed)
Pt ambulatory to triage room with c/o mid chest pressure radiating to RIGHT arm since 1 am this morning accompanied by headache. Pt reports sharp pain from right elbow to hand. [facility] 243 mg Aspirin. Pt reprots took 81 mg of Aspirin at 2 am which relieved chest pain, reports went to work and began to have pain again, took another 81 mg at 11 am and relieved pain again.

## 2017-01-15 NOTE — Discharge Instructions (Signed)
Please seek medical attention for any high fevers, chest pain, shortness of breath, change in behavior, persistent vomiting, bloody stool or any other new or concerning symptoms.  

## 2017-02-19 NOTE — Progress Notes (Signed)
Cardiology Office Note  Date:  02/20/2017   ID:  [name], DOB [birth year], MRN [id number]  PCP:  Joanna Dun, MD   Chief Complaint  Patient presents with  . other    ED follow up. Patient c/o chest pressure but not as bad as it was when she went to the hospital. Meds reviewed verbally with patient.     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 Derrill Kay in the emergency room for consultation of her chest pain, arm pain  She reports that on the evening of 01/14/2017 she was sleeping She woke up from sleep with significant central chest pain, jaw pain Also reported having right arm pain from the elbow down described as a tingling Notes from the emergency room indicate previous jaw pain on and off for a few weeks possibly related to stress  Chest pain was nonexertional She took aspirin for her discomfort and symptoms eased off that for several hours The next day had some tingling in her right arm, some mild discomfort in her chest on and off, took aspirin again Friend recommended she be evaluated and she went to urgent care. They did a EKG, and sent her to the emergency room  Workup in the emergency room was unrevealing, normal EKG, normal lab work Given her low risk profile, atypical presentation, she was recommended to follow-up with our office  EKG on today's visit grossly reviewed by myself shows normal sinus rhythm with rate 74 bpm no significant ST or T-wave changes  PMH:   has no past medical history on file.  PSH:    Past Surgical History:  Procedure Laterality Date  . CESAREAN SECTION     x 2    Current Outpatient Prescriptions  Medication Sig Dispense Refill  . aspirin EC 81 MG tablet Take 81 mg by mouth once a week.    . Biotin 40981 MCG TABS Take 1,000 mg by mouth daily.    . Cholecalciferol (D3-1000) 1000 units capsule Take 1,000 Units by  mouth daily.    . Coenzyme Q10 (CO Q-10) 100 MG CAPS Take 100 mg by mouth 2 (two) times daily.    . cyanocobalamin 1000 MCG tablet Take 1,000 mcg by mouth daily.    . Melatonin 3 MG CAPS Take 3 mg by mouth daily.    . Omega-3 Fatty Acids (FISH OIL) 1000 MG CAPS Take by mouth 2 (two) times daily.     No current facility-administered medications for this visit.      Allergies:   Patient has no known allergies.   Social History:  The patient  reports that she has never smoked. She has never used smokeless tobacco. She reports that she does not drink alcohol or use drugs.   Family History:   family history includes Thyroid disease in her father.    Review of Systems: Review of Systems  Constitutional: Negative.   Respiratory: Negative.   Cardiovascular: Positive for chest pain.  Gastrointestinal: Negative.   Musculoskeletal: Negative.        Tingling right arm  Neurological: Negative.   Psychiatric/Behavioral: Negative.   All other systems reviewed and are negative.    PHYSICAL EXAM: VS:  BP (!) 90/58 (BP Location: Right Arm, Patient Position: Sitting, Cuff Size: Normal)   Pulse 74   Ht 5\' 6"  (1.676 m)   Wt 131 lb (59.4 kg)   BMI  21.14 kg/m  , BMI Body mass index is 21.14 kg/m. GEN: Well nourished, well developed, in no acute distress  HEENT: normal  Neck: no JVD, carotid bruits, or masses Cardiac: RRR; no murmurs, rubs, or gallops,no edema  Respiratory:  clear to auscultation bilaterally, normal work of breathing GI: soft, nontender, nondistended, + BS MS: no deformity or atrophy  Skin: warm and dry, no rash Neuro:  Strength and sensation are intact Psych: euthymic mood, full affect    Recent Labs: 01/15/2017: BUN 22; Creatinine, Ser 0.86; Hemoglobin 12.6; Platelets 234; Potassium 4.0; Sodium 139    Lipid Panel Lab Results  Component Value Date   CHOL 188 01/09/2012   HDL 48.90 01/09/2012   LDLCALC 117 (H) 01/09/2012   TRIG 112.0 01/09/2012      Wt  Readings from Last 3 Encounters:  02/20/17 131 lb (59.4 kg)  01/15/17 130 lb (59 kg)  12/09/14 141 lb (64 kg)       ASSESSMENT AND PLAN:  Other chest pain - Plan: EKG 12-Lead Atypical presentation, presenting at rest while sleeping Low risk of coronary disease No diabetes, no hyperlipidemia, no smoking history Normal EKG, normal troponin on recent emergency room visit Long discussion concerning various treatment options for her chest pain symptoms Discussed routine treadmill testing, treadmill echo, even nuclear stress test Finally discussed CT coronary calcium scoring for risk stratification She will try walking on her treadmill at home to see if she is able to reproduce her chest pain symptoms. If she has good exercise tolerance, suspect she will not need any further testing. If she has any recurrent symptoms she will call us for ischemic workup  Pain of right upper extremity - Plan: EKG 12-Lead Atypical in nature, described as a tingling from elbow down Unable to exclude carpal tunnel, or epicondylitis   Total encounter time more than 60 minutes  Greater than 50% was spent in counseling and coordination of care with the patient  Disposition:   F/U as needed Patient was seen in consultation for Dr. Derrill KayGoodman will be referred to primary care for ongoing management of these issues detailed above   Orders Placed This Encounter  Procedures  . EKG 12-Lead     Signed, Dossie Arbourim Gollan, M.D., Ph.D. 02/20/2017  [facility] [place] [phone]

## 2017-02-20 ENCOUNTER — Ambulatory Visit (INDEPENDENT_AMBULATORY_CARE_PROVIDER_SITE_OTHER): Payer: No Typology Code available for payment source | Admitting: Cardiovascular Disease

## 2017-02-20 ENCOUNTER — Encounter: Payer: Self-pay | Admitting: Cardiovascular Disease

## 2017-02-20 VITALS — BP 90/58 | HR 74 | Ht 66.0 in | Wt 131.0 lb

## 2017-02-20 DIAGNOSIS — R0789 Other chest pain: Secondary | ICD-10-CM | POA: Diagnosis not present

## 2017-02-20 DIAGNOSIS — M79601 Pain in right arm: Secondary | ICD-10-CM

## 2017-02-20 NOTE — Patient Instructions (Signed)
Medication Instructions:   No medication changes made  Labwork:  No new labs needed  Testing/Procedures:  No further testing at this time   I recommend watching educational videos on topics of interest to you at:       www.goemmi.com  Enter code: [email or web address]    Follow-Up: It was a pleasure seeing you in the office today. Please call us if you have new issues that need to be addressed before your next appt.  [phone]  Your physician wants you to follow-up in:  Research CT coronary calcium score [place], $150  If you need a refill on your cardiac medications before your next appointment, please call your pharmacy.

## 2017-10-06 IMAGING — CR DG CHEST 2V
2 series · 2 of 2 positions shown · non-contrast
Comparison: None.

CLINICAL DATA: Chest pain radiating to right arm for 1 day.

EXAM:
CHEST  2 VIEW

[chest pa]
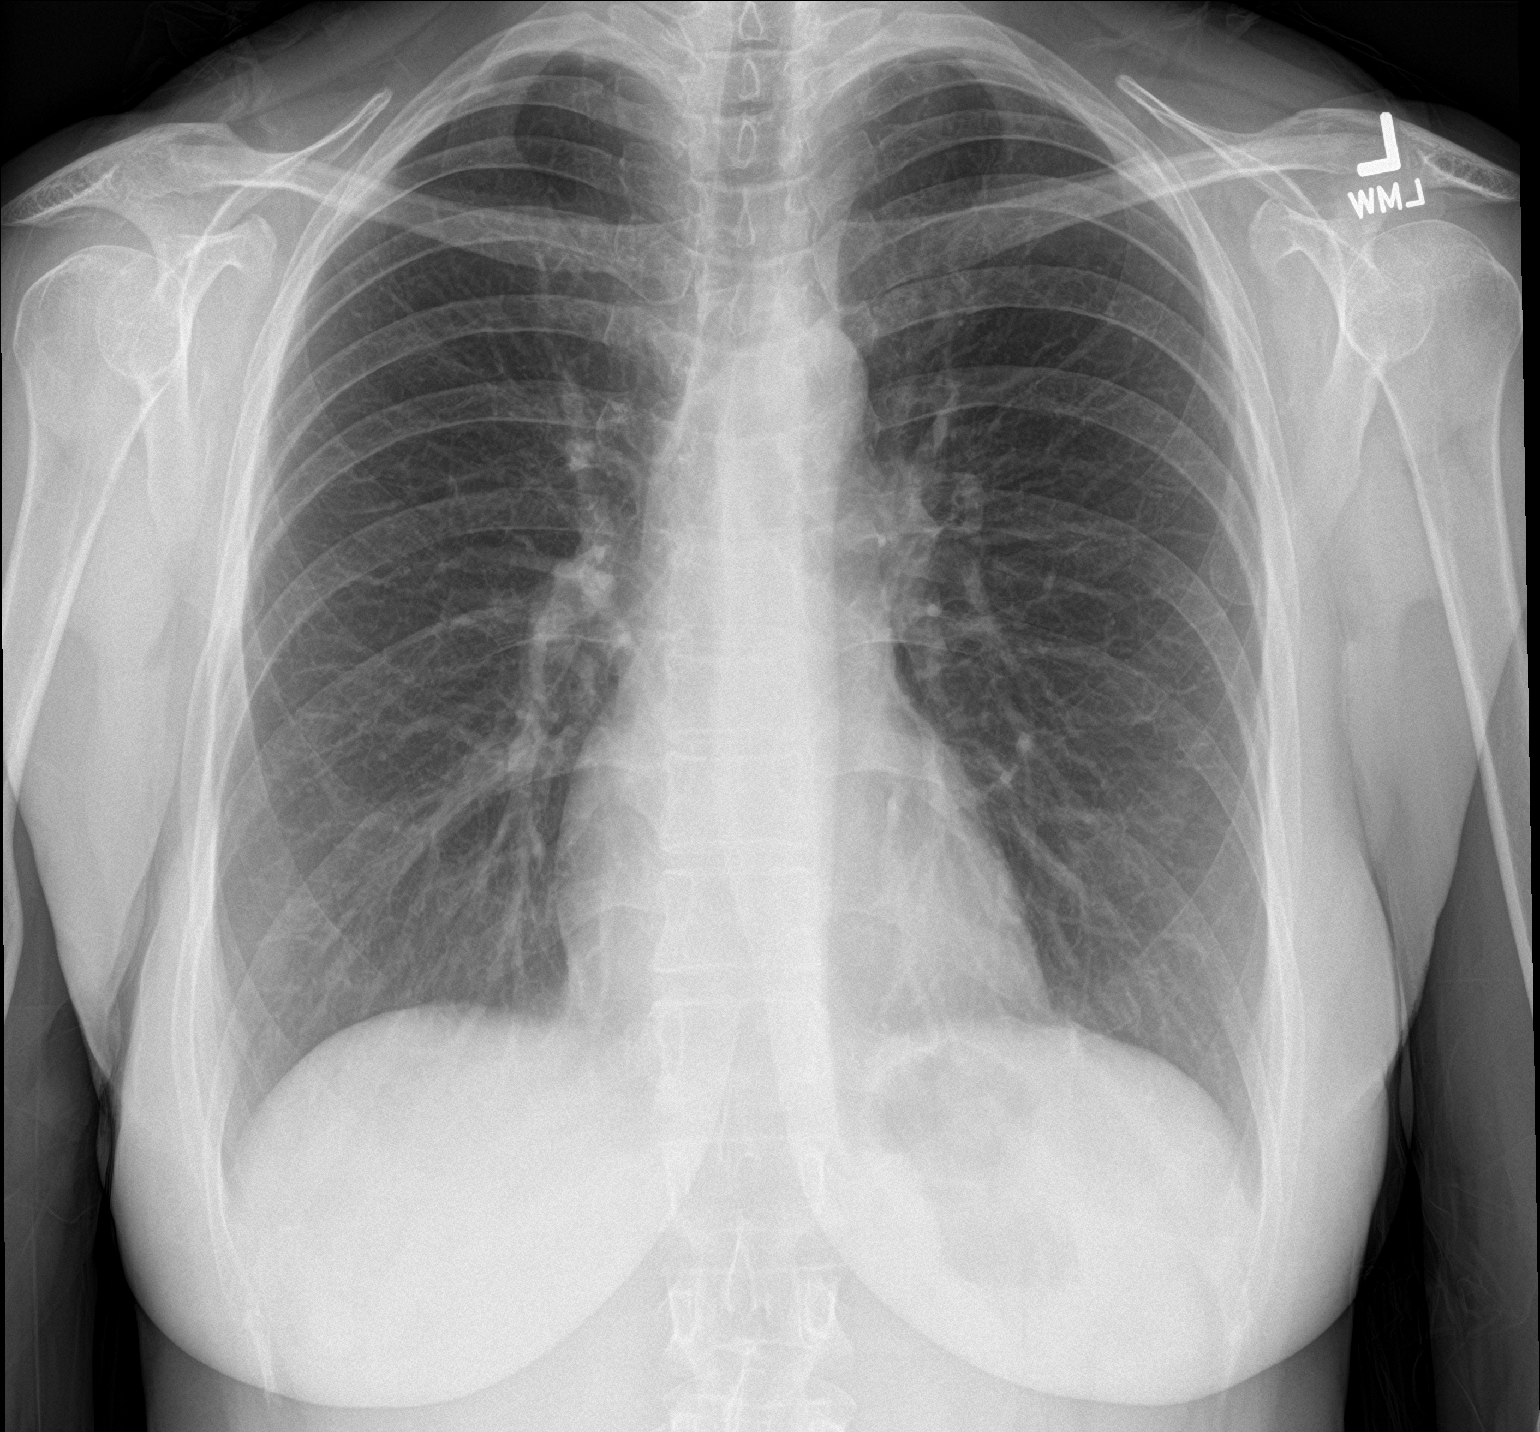

[chest lat]
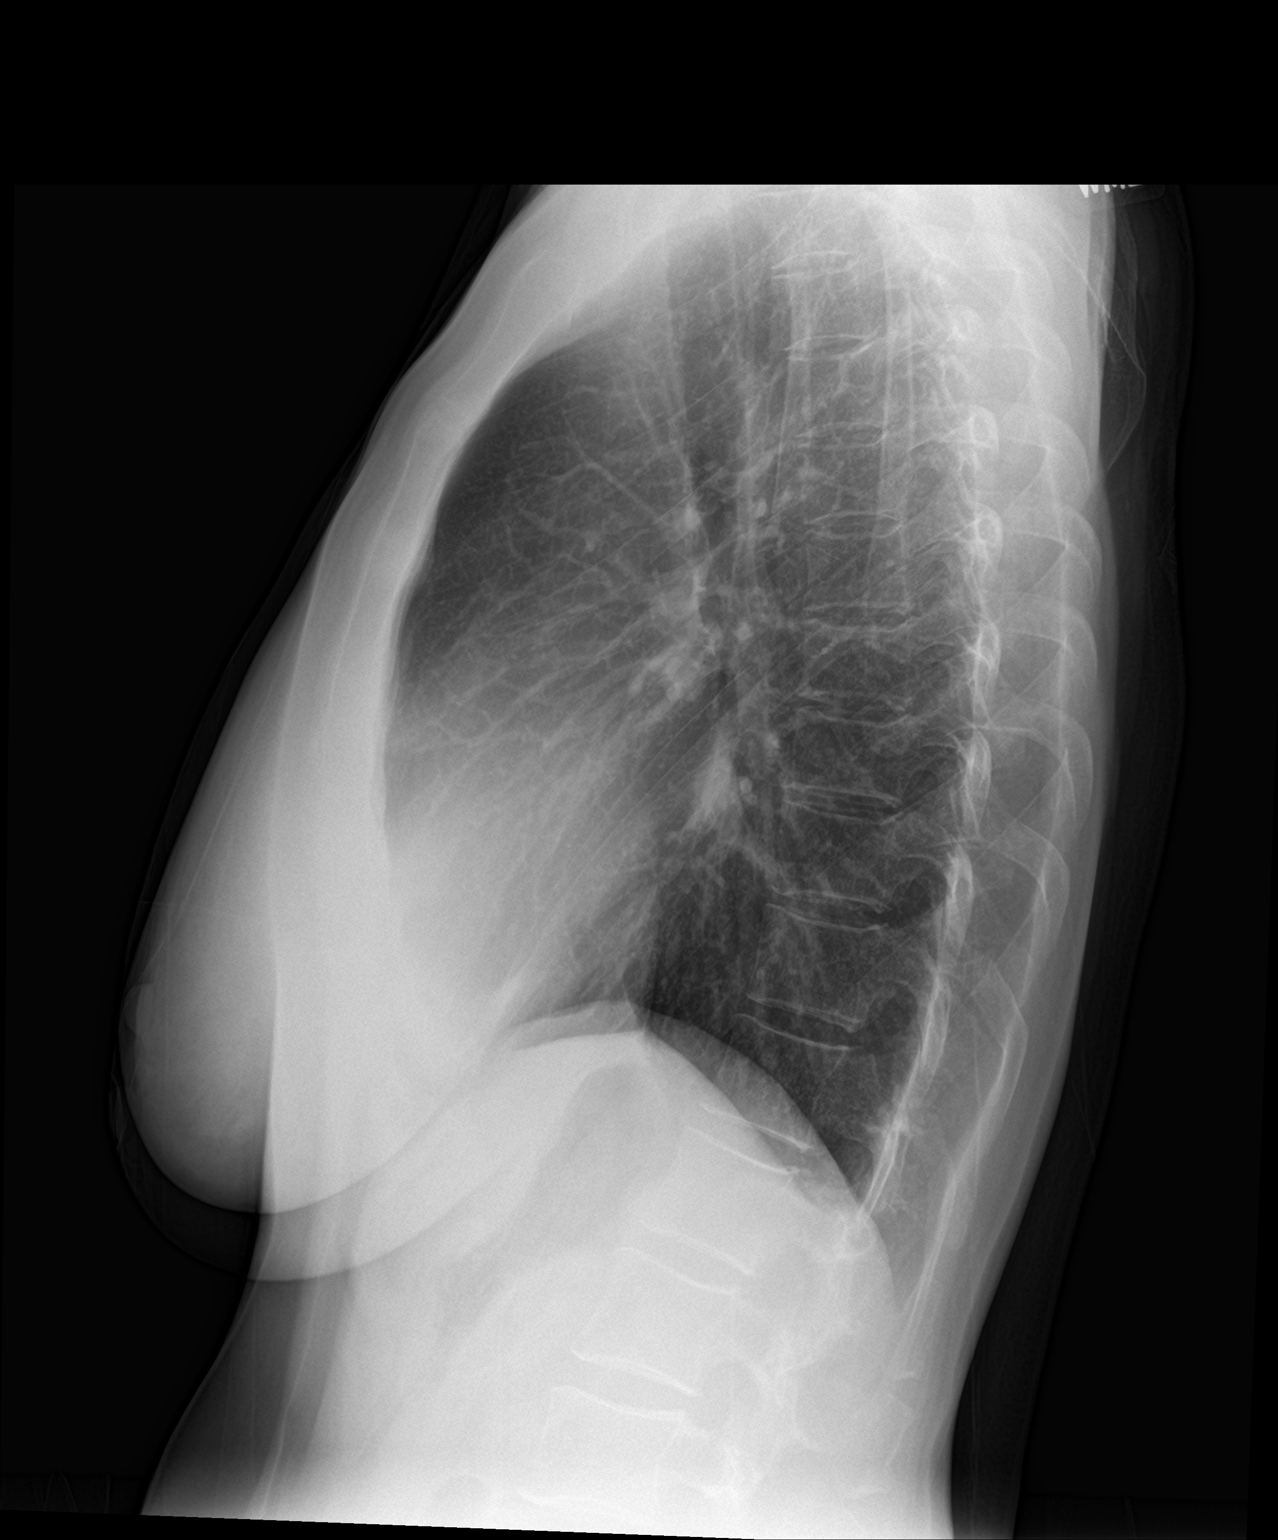

[2 of 2 positions shown; findings below may reference images not displayed]

FINDINGS: The heart size and mediastinal contours are within normal limits.
Both lungs are clear. The visualized skeletal structures are
unremarkable.
IMPRESSION: Negative.  No active cardiopulmonary disease.

## 2019-01-21 ENCOUNTER — Telehealth: Payer: Self-pay | Admitting: Family Medicine

## 2019-01-21 NOTE — Telephone Encounter (Signed)
Called and left vm for patient. Calling to verify PCP. Patient has not been seen since 2013

## 2020-01-21 ENCOUNTER — Ambulatory Visit: Payer: PRIVATE HEALTH INSURANCE | Admitting: Family Medicine

## 2020-01-21 ENCOUNTER — Telehealth: Payer: Self-pay

## 2020-01-21 ENCOUNTER — Other Ambulatory Visit: Payer: Self-pay

## 2020-01-21 ENCOUNTER — Encounter: Payer: Self-pay | Admitting: Family Medicine

## 2020-01-21 ENCOUNTER — Telehealth: Payer: Self-pay | Admitting: *Deleted

## 2020-01-21 VITALS — BP 108/62 | HR 62 | Temp 98.0°F | Ht 66.0 in | Wt 141.8 lb

## 2020-01-21 DIAGNOSIS — R040 Epistaxis: Secondary | ICD-10-CM

## 2020-01-21 NOTE — Telephone Encounter (Signed)
Spoke with patient, aware that we need her to come by the office one day next week and sign a ROI so that we are able to get records from Dr P[facility].  Pt to come by one days next week between 8-5  Form up front.

## 2020-01-21 NOTE — Progress Notes (Signed)
   Subjective:    Patient ID: [name], female    DOB: 04-03-1966, 54 y.o.   MRN: [id number]  HPI Chief Complaint  Patient presents with  . Epistaxis    x 2 weeks ago, only from left nare. Pt states that when the has the nosebleeds it is gushing. Pt states that yesterday she used almost a 1/2 box of tissues before it stopped. Pt uses ice to help stop bleeding. Pt states that her nose bled for a total of 1.5 hr, gushing at times. Denies pain, headache or sinus pressure with episodes.    Only from left nare. No history of seasonal allergies. No runny nose or nasal congestion. Has had 6-7 episodes in last 2 weeks. Some were very light. Able to stop with compression, ice.  Used Afrin x1.  She works in a Theme park manager. Doesn't feel like environment is very dry.  She does not take any regular medications that would cause blood thinning.  She denies headaches, visual changes. She has not had any health care for several years.  She reports that she is very healthy and is currently without health insurance.  She declines any blood work today.  Has upcoming appointment next month to establish care.  She previously saw Dr. Clifton Custard.  Review of Systems Per HPI    Objective:   Physical Exam Vitals reviewed.  Constitutional:      General: She is not in acute distress.    Appearance: Normal appearance. She is normal weight. She is not ill-appearing, toxic-appearing or diaphoretic.  HENT:     Head: Normocephalic and atraumatic.     Nose: No nasal deformity, septal deviation, signs of injury, laceration, nasal tenderness, mucosal edema, congestion or rhinorrhea.     Right Nostril: No foreign body, epistaxis or occlusion.     Left Nostril: No foreign body, epistaxis or occlusion.     Right Turbinates: Not enlarged, swollen or pale.     Left Turbinates: Not enlarged, swollen or pale.     Mouth/Throat:     Mouth: Mucous membranes are moist.  Neurological:     Mental Status: She is alert.        BP 108/62 (BP Location: Left Arm, Patient Position: Sitting, Cuff Size: Normal)   Pulse 62   Temp 98 F (36.7 C) (Temporal)   Ht 5\' 6"  (1.676 m)   Wt 141 lb 12.8 oz (64.3 kg)   SpO2 100%   BMI 22.89 kg/m  Wt Readings from Last 3 Encounters:  01/21/20 141 lb 12.8 oz (64.3 kg)  02/20/17 131 lb (59.4 kg)  01/15/17 130 lb (59 kg)       Assessment & Plan:  1. Recurrent epistaxis -No visible nasal abnormality -Discussed possible causes and provided written information including interventions if recurs -If continued episodes to ENT  This visit occurred during the SARS-CoV-2 public health emergency.  Safety protocols were in place, including screening questions prior to the visit, additional usage of staff PPE, and extensive cleaning of exam room while observing appropriate contact time as indicated for disinfecting solutions.      03/17/17, FNP-BC  [facility], [facility]  01/21/2020 2:38 PM

## 2020-01-21 NOTE — Patient Instructions (Signed)
Afrin on OB tampon can be inserted in nostril   Nosebleed, Adult A nosebleed is when blood comes out of the nose. Nosebleeds are common. Usually, they are not a sign of a serious condition. Nosebleeds can happen if a small blood vessel in your nose starts to bleed or if the lining of your nose (mucous membrane) cracks. They are commonly caused by:  Allergies.  Colds.  Picking your nose.  Blowing your nose too hard.  An injury from sticking an object into your nose or getting hit in the nose.  Dry or cold air. Less common causes of nosebleeds include:  Toxic fumes.  Something abnormal in the nose or in the air-filled spaces in the bones of the face (sinuses).  Growths in the nose, such as polyps.  Medicines or conditions that cause blood to clot slowly.  Certain illnesses or procedures that irritate or dry out the nasal passages. Follow these instructions at home: When you have a nosebleed:   Sit down and tilt your head slightly forward.  Use a clean towel or tissue to pinch your nostrils under the bony part of your nose. After 10 minutes, let go of your nose and see if bleeding starts again. Do not release pressure before that time. If there is still bleeding, repeat the pinching and holding for 10 minutes until the bleeding stops.  Do not place tissues or gauze in the nose to stop bleeding.  Avoid lying down and avoid tilting your head backward. That may make blood collect in the throat and cause gagging or coughing.  Use a nasal spray decongestant to help with a nosebleed as told by your health care provider.  Do not use petroleum jelly or mineral oil in your nose. It can drip into your lungs. After a nosebleed:  Avoid blowing your nose or sniffing for a number of hours.  Avoid straining, lifting, or bending at the waist for several days. You may resume other normal activities as you are able.  Use saline spray or a humidifier as told by your health care provider.   Aspirinand blood thinners make bleeding more likely. If you are prescribed these medicines and you suffer from nosebleeds: ? Ask your health care provider if you should stop taking the medicines or if you should adjust the dose. ? Do not stop taking medicines that your health care provider has recommended unless told by your health care provider.  If your nosebleed was caused by dry mucous membranes, use over-the-counter saline nasal spray or gel. This will keep the mucous membranes moist and allow them to heal. If you must use a lubricant: ? Choose one that is water-soluble. ? Use only as much as you need and use it only as often as needed. ? Do not lie down until several hours after you use it. Contact a health care provider if:  You have a fever.  You get nosebleeds often or more often than usual.  You bruise very easily.  You have a nosebleed from having something stuck in your nose.  You have bleeding in your mouth.  You vomit or cough up brown material.  You have a nosebleed after you start a new medicine. Get help right away if:  You have a nosebleed after a fall or a head injury.  Your nosebleed does not go away after 20 minutes.  You feel dizzy or weak.  You have unusual bleeding from other parts of your body.  You have unusual bruising on  other parts of your body.  You become sweaty.  You vomit blood. This information is not intended to replace advice given to you by your health care provider. Make sure you discuss any questions you have with your health care provider. Document Revised: 11/25/2017 Document Reviewed: 03/12/2016 Elsevier Patient Education  2020 [employer].

## 2020-01-21 NOTE — Telephone Encounter (Signed)
Per chart review tab pt saw Harlin Heys FNP today at 10:15.

## 2020-01-21 NOTE — Telephone Encounter (Signed)
[facility] Night - Client TELEPHONE ADVICE RECORD [facility] Patient Name: [name] Gender: Female DOB: Jan 20, 1966 Age: 54 Y 6 M 5 D Return Phone Number: [phone] (Primary) Address: City/State/Zip: [place] [address] Client [facility] Night - Client Client Site [facility] [place] - Night Physician Ruthe Mannan- MD Contact Type Call Who Is Calling Patient / Member / Family / Caregiver Call Type Triage / Clinical Relationship To Patient Self Return Phone Number [phone] (Primary) Chief Complaint Nosebleed Reason for Call Symptomatic / Request for Health Information Initial Comment Caller states she has been having nosebleeds. Has been going on over week. Translation No Nurse Assessment Nurse: Milana Na, RN, Donnamarie Poag Date/Time (Eastern Time): 01/21/2020 8:15:40 AM Confirm and document reason for call. If symptomatic, describe symptoms. ---Caller states she has been having nosebleeds. Has been going on over week. Had a nosebleed 2 weeks ago 3 times in 1 day then a few days later had 1. Had another one yesterday. Has the patient had close contact with a person known or suspected to have the novel coronavirus illness OR traveled / lives in area with major community spread (including international travel) in the last 14 days from the onset of symptoms? * If Asymptomatic, screen for exposure and travel within the last 14 days. ---No Does the patient have any new or worsening symptoms? ---Yes Will a triage be completed? ---Yes Related visit to physician within the last 2 weeks? ---No Does the PT have any chronic conditions? (i.e. diabetes, asthma, this includes High risk factors for pregnancy, etc.) ---No Is the patient pregnant or possibly pregnant? (Ask all females between the ages of 37-55) ---No Is this a behavioral health or substance abuse call? ---No Guidelines Guideline Title Affirmed Question  Affirmed Notes Nurse Date/Time (Eastern Time) Nosebleed [1] Skin bruises or bleeding gums AND [2] not caused by an injury Milana Na, RN, Donnamarie Poag 01/21/2020 8:18:32 AM Disp. Time Lamount Cohen Time) Disposition Final User 01/21/2020 8:22:06 AM See PCP within 24 Hours Yes [place], RN, Donnamarie Poag PLEASE NOTE: All timestamps contained within this report are represented as [place] Standard Time. CONFIDENTIALTY NOTICE: This fax transmission is intended only for the addressee. It contains information that is legally privileged, confidential or otherwise protected from use or disclosure. If you are not the intended recipient, you are strictly prohibited from reviewing, disclosing, copying using or disseminating any of this information or taking any action in reliance on or regarding this information. If you have received this fax in error, please notify us immediately by telephone so that we can arrange for its return to [place]. Phone: [phone], Toll-Free: [phone], Fax: [phone] Page: 2 of 2 Call Id: [id number] Caller Disagree/Comply Comply Caller Understands Yes PreDisposition Call Doctor Care Advice Given Per Guideline SEE PCP WITHIN 24 HOURS: * IF OFFICE WILL BE OPEN: You need to be seen within the next 24 hours. Call your doctor (or NP/PA) when the office opens and make an appointment. * IF OFFICE WILL BE CLOSED AND NO PCP (PRIMARY CARE PROVIDER) SECOND-LEVEL TRIAGE: You need to be seen within the next 24 hours. A clinic or an urgent care center is often a good source of care if your doctor's office is closed or you can't get an appointment. * First gently blow the nose to clear out any large clots. * LEAN FORWARD: Sit down and lean forward. Reason: blood makes people choke if they lean backwards. [facility] THE NOSE: Gently squeeze the soft parts of the lower nose (  nostrils) together. Use your thumb and your index finger in a pinching manner. Do this for 15 minutes. Use a clock or watch to measure  the time. Goal: apply constant pressure to the bleeding point. * If the bleeding continues after 15 minutes of squeezing, move your point of pressure and repeat again for another 15 minutes. * You become worse. * Nosebleeds become worse * Lightheadedness or weakness occurs * Nosebleeding lasts longer than 30 minutes with using direct pressure Referrals REFERRED TO PCP OFFICE

## 2020-01-28 NOTE — Telephone Encounter (Signed)
Pt had visit with Harlin Heys FNP on 01/21/20.

## 2020-02-08 NOTE — Telephone Encounter (Signed)
Left a detailed message requesting patient to come by and sign ROI - form is up in file folder. Pt has been made aware   Nothing further needed.

## 2020-03-03 ENCOUNTER — Ambulatory Visit: Payer: No Typology Code available for payment source | Admitting: Family Medicine

## 2020-03-16 ENCOUNTER — Other Ambulatory Visit: Payer: Self-pay

## 2020-03-16 ENCOUNTER — Ambulatory Visit (INDEPENDENT_AMBULATORY_CARE_PROVIDER_SITE_OTHER): Payer: Managed Care, Other (non HMO) | Admitting: Family Medicine

## 2020-03-16 ENCOUNTER — Encounter: Payer: Self-pay | Admitting: Family Medicine

## 2020-03-16 VITALS — BP 90/60 | HR 100 | Temp 97.7°F | Ht 65.0 in | Wt 150.0 lb

## 2020-03-16 DIAGNOSIS — Z1159 Encounter for screening for other viral diseases: Secondary | ICD-10-CM

## 2020-03-16 DIAGNOSIS — M79671 Pain in right foot: Secondary | ICD-10-CM | POA: Diagnosis not present

## 2020-03-16 DIAGNOSIS — Z23 Encounter for immunization: Secondary | ICD-10-CM

## 2020-03-16 DIAGNOSIS — Z114 Encounter for screening for human immunodeficiency virus [HIV]: Secondary | ICD-10-CM | POA: Diagnosis not present

## 2020-03-16 DIAGNOSIS — Z13220 Encounter for screening for lipoid disorders: Secondary | ICD-10-CM

## 2020-03-16 DIAGNOSIS — Z0000 Encounter for general adult medical examination without abnormal findings: Secondary | ICD-10-CM

## 2020-03-16 DIAGNOSIS — Z1211 Encounter for screening for malignant neoplasm of colon: Secondary | ICD-10-CM

## 2020-03-16 NOTE — Assessment & Plan Note (Signed)
Referral to podiatry. Etiology unclear - suspect it may be related to flat foot but also wondered about neuroma given location though unable to palpate anything beyond the callus.

## 2020-03-16 NOTE — Patient Instructions (Signed)

## 2020-03-16 NOTE — Progress Notes (Deleted)
   Subjective:     [name] is a 54 y.o. female presenting for Establish Care and Foot Pain ("a ball on underside of foot near toes" )     HPI  #Foot pain - has a ball on the bottom of her foot - present x 1 year - thought it was getting better but still present - painful when barefoot or thin shoes - no color change  Sees GYN - they have    Review of Systems   Social History   Tobacco Use  Smoking Status Never Smoker  Smokeless Tobacco Never Used        Objective:    BP Readings from Last 3 Encounters:  03/16/20 90/60  01/21/20 108/62  02/20/17 (!) 90/58   Wt Readings from Last 3 Encounters:  03/16/20 150 lb (68 kg)  01/21/20 141 lb 12.8 oz (64.3 kg)  02/20/17 131 lb (59.4 kg)    BP 90/60   Pulse 100   Temp 97.7 F (36.5 C) (Temporal)   Ht 5\' 5"  (1.651 m)   Wt 150 lb (68 kg)   LMP 10/31/2014   SpO2 99%   BMI 24.96 kg/m    Physical Exam        Assessment & Plan:   Problem List Items Addressed This Visit    None       No follow-ups on file.  11/02/2014, MD  This visit occurred during the SARS-CoV-2 public health emergency.  Safety protocols were in place, including screening questions prior to the visit, additional usage of staff PPE, and extensive cleaning of exam room while observing appropriate contact time as indicated for disinfecting solutions.

## 2020-03-16 NOTE — Progress Notes (Signed)
Annual Exam   Chief Complaint:  Chief Complaint  Patient presents with  . Establish Care  . Foot Pain    "a ball on underside of foot near toes"     History of Present Illness:  Ms. [name] is a 54 y.o. No obstetric history on file. who LMP was Patient's last menstrual period was 10/31/2014., presents today for her annual examination.    #Foot pain - has a ball on the bottom of her foot - present x 1 year - thought it was getting better but still present - painful when barefoot or thin shoes - no color change  Sees GYN - they have  Nutrition  Diet: varied, sweet tooth but otherwise healthy Exercise: every other day - cardio and weights  Safety The patient wears seatbelts: yes.     The patient feels safe at home and in their relationships: yes.   Menstrual:  No period x 2 years  GYN She is single partner, contraception - post menopausal status.    Cervical Cancer Screening (21-65):   Reports up to date with GYN  Breast Cancer Screening (Age 65-74):  Reports up to date with GYN   Colon Cancer Screening:  Age 29-75 yo - benefits outweigh the risk. Adults 47-85 yo who have never been screened benefit.  Benefits: 134000 people in 2016 will be diagnosed and 49,000 will die - early detection helps [place]: Complications 2/2 to colonoscopy High Risk (Colonoscopy): genetic disorder (Lynch syndrome or familial adenomatous polyposis), personal hx of IBD, previous adenomatous polyp, or previous colorectal cancer, FamHx start 10 years before the age at diagnosis, increased in males and black race  Options:  FIT - looks for hemoglobin (blood in the stool) - specific and fairly sensitive - must be done annually Cologuard - looks for DNA and blood - more sensitive - therefore can have more false positives, every 3 years Colonoscopy - every 10 years if normal - sedation, bowl prep, must have someone drive you  Shared decision making and the patient had decided to do  Colonoscopy.   Social History   Tobacco Use  Smoking Status Never Smoker  Smokeless Tobacco Never Used    Weight Wt Readings from Last 3 Encounters:  03/16/20 150 lb (68 kg)  01/21/20 141 lb 12.8 oz (64.3 kg)  02/20/17 131 lb (59.4 kg)   Patient has normal BMI  BMI Readings from Last 1 Encounters:  03/16/20 24.96 kg/m     Chronic disease screening Blood pressure monitoring:  BP Readings from Last 3 Encounters:  03/16/20 90/60  01/21/20 108/62  02/20/17 (!) 90/58    Lipid Monitoring: Indication for screening: age >80, obesity, diabetes, family hx, CV risk factors.  Lipid screening: Yes  Lab Results  Component Value Date   CHOL 188 01/09/2012   HDL 48.90 01/09/2012   LDLCALC 117 (H) 01/09/2012   TRIG 112.0 01/09/2012   CHOLHDL 4 01/09/2012     Diabetes Screening: age >64, overweight, family hx, PCOS, hx of gestational diabetes, at risk ethnicity Diabetes Screening screening: Yes  No results found for: HGBA1C   History reviewed. No pertinent past medical history.  Past Surgical History:  Procedure Laterality Date  . CESAREAN SECTION     x 2    Prior to Admission medications   Medication Sig Start Date End Date Taking? Authorizing Provider  B Complex-C (B-COMPLEX WITH VITAMIN C) tablet Take 1 tablet by mouth daily.   Yes [provider]  Cholecalciferol (VITAMIN D3  PO) Take 2,000 mcg by mouth daily.   Yes [provider]  Boris Lown Oil 1000 MG CAPS Take 1 capsule by mouth daily.   Yes [provider]  Magnesium 200 MG TABS Take 1 tablet by mouth daily.   Yes [provider]  melatonin 1 MG TABS tablet Take 2 mg by mouth at bedtime.   Yes [provider]  Multiple Vitamins-Minerals (ZINC PO) Take by mouth.   Yes [provider]  Vitamin A 2400 MCG (8000 UT) CAPS Take 1 capsule by mouth daily.   Yes [provider]    No Known Allergies  Gynecologic History: Patient's last menstrual period was  10/31/2014.  Obstetric History: No obstetric history on file.  Social History   Socioeconomic History  . Marital status: Married    Spouse name: Freida Busman  . Number of children: 2  . Years of education: 2 bachelor's degree  . Highest education level: Not on file  Occupational History  . Occupation: Administrator  Tobacco Use  . Smoking status: Never Smoker  . Smokeless tobacco: Never Used  Vaping Use  . Vaping Use: Never used  Substance and Sexual Activity  . Alcohol use: Not Currently    Comment: 1-2 per year  . Drug use: No  . Sexual activity: Not Currently    Birth control/protection: Post-menopausal  Other Topics Concern  . Not on file  Social History Narrative   03/16/20   From: [place], but here for years   Living: with husband, Freida Busman (1999)   Work: Armed forces operational officer in [place]      Family: Ladona Ridgel (2004) and Fredricka Bonine (2006)      Enjoys: sleep, baking      Exercise: every other day - lifting weights and cardio w/ her son   Diet: healthy - husband does cooking - low fat, low carb, sweet tooth      Safety   Seat belts: Yes    Guns: Yes  and secure   Safe in relationships: Yes    Social Determinants of Health   Financial Resource Strain:   . Difficulty of Paying Living Expenses:   Food Insecurity:   . Worried About Programme researcher, broadcasting/film/video in the Last Year:   . Barista in the Last Year:   Transportation Needs:   . Freight forwarder (Medical):   Marland Kitchen Lack of Transportation (Non-Medical):   Physical Activity:   . Days of Exercise per Week:   . Minutes of Exercise per Session:   Stress:   . Feeling of Stress :   Social Connections:   . Frequency of Communication with Friends and Family:   . Frequency of Social Gatherings with Friends and Family:   . Attends Religious Services:   . Active Member of Clubs or Organizations:   . Attends Banker Meetings:   Marland Kitchen Marital Status:   Intimate Partner Violence:   . Fear of Current or Ex-Partner:   .  Emotionally Abused:   Marland Kitchen Physically Abused:   . Sexually Abused:     Family History  Problem Relation Age of Onset  . Lung cancer Father 26  . Dementia Mother   . Cancer Paternal Grandmother        unknown type  . Lung cancer Paternal Grandfather     Review of Systems  Constitutional: Negative for chills and fever.  HENT: Negative for congestion and sore throat.   Eyes: Negative for blurred vision and double vision.  Respiratory: Negative for shortness of breath.   Cardiovascular: Negative for chest pain.  Gastrointestinal: Negative for heartburn, nausea and vomiting.  Genitourinary: Negative.   Musculoskeletal: Negative.  Negative for myalgias.       Foot pain  Skin: Negative for rash.  Neurological: Negative for dizziness and headaches.  Endo/Heme/Allergies: Does not bruise/bleed easily.  Psychiatric/Behavioral: Negative for depression. The patient is not nervous/anxious.      Physical Exam BP 90/60   Pulse 100   Temp 97.7 F (36.5 C) (Temporal)   Ht 5\' 5"  (1.651 m)   Wt 150 lb (68 kg)   LMP 10/31/2014   SpO2 99%   BMI 24.96 kg/m    BP Readings from Last 3 Encounters:  03/16/20 90/60  01/21/20 108/62  02/20/17 (!) 90/58      Physical Exam Constitutional:      General: She is not in acute distress.    Appearance: She is well-developed. She is not diaphoretic.  HENT:     Head: Normocephalic and atraumatic.     Right Ear: External ear normal.     Left Ear: External ear normal.     Nose: Nose normal.  Eyes:     General: No scleral icterus.    Conjunctiva/sclera: Conjunctivae normal.  Cardiovascular:     Rate and Rhythm: Normal rate and regular rhythm.     Heart sounds: No murmur heard.   Pulmonary:     Effort: Pulmonary effort is normal. No respiratory distress.     Breath sounds: Normal breath sounds. No wheezing.  Abdominal:     General: Bowel sounds are normal. There is no distension.     Palpations: Abdomen is soft. There is no mass.      Tenderness: There is no abdominal tenderness. There is no guarding or rebound.  Musculoskeletal:        General: Normal range of motion.     Cervical back: Neck supple.     Comments: Right foot: Inspection: callus over area of concern, b/l flat foot with some collapse Palpation: no TTP noted, no subdermal lesions ROM: norma   Lymphadenopathy:     Cervical: No cervical adenopathy.  Skin:    General: Skin is warm and dry.     Capillary Refill: Capillary refill takes less than 2 seconds.  Neurological:     Mental Status: She is alert and oriented to person, place, and time.     Deep Tendon Reflexes: Reflexes normal.  Psychiatric:        Behavior: Behavior normal.     Assessment: 54 y.o. No obstetric history on file. female here for routine annual physical examination.  Plan: Problem List Items Addressed This Visit      Other   Right foot pain    Referral to podiatry. Etiology unclear - suspect it may be related to flat foot but also wondered about neuroma given location though unable to palpate anything beyond the callus.       Relevant Orders   Ambulatory referral to Podiatry    Other Visit Diagnoses    Annual physical exam    -  Primary   Relevant Orders   Comprehensive metabolic panel   Screening for hyperlipidemia       Relevant Orders   Lipid panel   Screening for HIV (human immunodeficiency virus)       Relevant Orders   HIV Antibody (routine testing w rflx)   Encounter for hepatitis C screening test for low risk patient  Relevant Orders   Hepatitis C antibody   Screening for colon cancer       Relevant Orders   Ambulatory referral to Gastroenterology   Need for Tdap vaccination       Relevant Orders   Tdap vaccine greater than or equal to 7yo IM (Completed)      Screening: -- Blood pressure screen normal -- cholesterol screening: will obtain -- Weight screening: normal -- Diabetes Screening: will obtain -- Nutrition: Encouraged healthy  diet  The ASCVD Risk score Denman George DC Jr., et al., 2013) failed to calculate for the following reasons:   Cannot find a previous HDL lab   Cannot find a previous total cholesterol lab  -- Statin therapy for Age 39-75 with CVD risk >7.5%  Psych -- Depression screening (PHQ-9):    Safety -- tobacco screening: not using -- alcohol screening:  low-risk usage. -- no evidence of domestic violence or intimate partner violence.   Cancer Screening -- pap smear will ge records  -- family history of breast cancer screening: done. not at high risk. -- Mammogram - will get records -- Colon cancer (age 38+)-- referral placed  Immunizations Immunization History  Administered Date(s) Administered  . Tdap 03/16/2020    -- flu vaccine unknown -- TDAP q10 years done today -- Covid-19 Vaccine declined   Encouraged healthy diet and exercise. Encouraged regular vision and dental care.    Lynnda Child, MD

## 2020-03-17 LAB — LIPID PANEL
Cholesterol: 223 mg/dL — ABNORMAL HIGH (ref 0–200)
HDL: 59.2 mg/dL (ref >39.00)
LDL Cholesterol: 133 mg/dL — ABNORMAL HIGH (ref 0–99)
NonHDL: 163.46
Total CHOL/HDL Ratio: 4
Triglycerides: 151 mg/dL — ABNORMAL HIGH (ref 0.0–149.0)
VLDL: 30.2 mg/dL (ref 0.0–40.0)

## 2020-03-17 LAB — COMPREHENSIVE METABOLIC PANEL
ALT: 25 U/L (ref 0–35)
AST: 24 U/L (ref 0–37)
Albumin: 4.3 g/dL (ref 3.5–5.2)
Alkaline Phosphatase: 68 U/L (ref 39–117)
BUN: 19 mg/dL (ref 6–23)
CO2: 31 mEq/L (ref 19–32)
Calcium: 9.2 mg/dL (ref 8.4–10.5)
Chloride: 103 mEq/L (ref 96–112)
Creatinine, Ser: 0.95 mg/dL (ref 0.40–1.20)
GFR: 61.38 mL/min (ref >60.00)
Glucose, Bld: 78 mg/dL (ref 70–99)
Potassium: 3.9 mEq/L (ref 3.5–5.1)
Sodium: 141 mEq/L (ref 135–145)
Total Bilirubin: 0.4 mg/dL (ref 0.2–1.2)
Total Protein: 6.9 g/dL (ref 6.0–8.3)

## 2020-03-17 LAB — HIV ANTIBODY (ROUTINE TESTING W REFLEX): HIV 1&2 Ab, 4th Generation: NONREACTIVE

## 2020-03-17 LAB — HEPATITIS C ANTIBODY
Hepatitis C Ab: NONREACTIVE
SIGNAL TO CUT-OFF: 0.01 (ref <1.00)

## 2020-03-30 ENCOUNTER — Telehealth: Payer: Managed Care, Other (non HMO)

## 2021-06-09 DEATH — deceased

## 2023-09-11 ENCOUNTER — Ambulatory Visit
Admission: EM | Admit: 2023-09-11 | Discharge: 2023-09-11 | Disposition: A | Discharge status: Home / Self Care | Payer: 59 | Attending: Emergency Medicine | Admitting: Emergency Medicine

## 2023-09-11 DIAGNOSIS — B029 Zoster without complications: Secondary | ICD-10-CM

## 2023-09-11 MED ORDER — VALACYCLOVIR HCL 1 G PO TABS: 1000.0000 mg | ORAL_TABLET | Freq: Three times a day (TID) | ORAL | 0 refills | Status: AC

## 2023-09-11 NOTE — Discharge Instructions (Addendum)
Take the Valtrex as directed.    Follow up with your primary care provider if your symptoms are not improving.

## 2023-09-11 NOTE — ED Triage Notes (Signed)
 Patient to Urgent Care with complaints of painful rash to left shoulder. Multiple small blisters. Pain radiates into ribs/ armpit.  Symptoms started two nights ago.   No hx of shingles.

## 2023-09-11 NOTE — ED Provider Notes (Signed)
 [facility]AL[facility]ELT    CSN: [id number] Arrival date & time: 09/11/23  9180      History   Chief Complaint Chief Complaint  Patient presents with   Rash   Back Pain    HPI [name] is a 58 y.o. female.   Patient presents with a painful rash on her left upper back x 2 days.  The pain started first and then she developed a blister-like rash.  No fever, cough, or shortness of breath.  No OTC medications taken today but took Tylenol  yesterday.  The history is provided by the patient and medical records.    Past Medical History:  Diagnosis Date   Chicken pox     Patient Active Problem List   Diagnosis Date Noted   Right foot pain 03/16/2020   Fatigue 01/09/2012    Past Surgical History:  Procedure Laterality Date   CESAREAN SECTION     x 2    OB History   No obstetric history on file.      Home Medications    Prior to Admission medications   Medication Sig Start Date End Date Taking? Authorizing Provider  valACYclovir  (VALTREX ) 1000 MG tablet Take 1 tablet (1,000 mg total) by mouth 3 (three) times daily. 09/11/23  Yes Corlis Burnard DEL, NP  B Complex-C (B-COMPLEX WITH VITAMIN C) tablet Take 1 tablet by mouth daily.    [provider]  Cholecalciferol (VITAMIN D3 PO) Take 2,000 mcg by mouth daily.    [provider]  Anselm Oil 1000 MG CAPS Take 1 capsule by mouth daily.    [provider]  Magnesium 200 MG TABS Take 1 tablet by mouth daily.    [provider]  melatonin 1 MG TABS tablet Take 2 mg by mouth at bedtime.    [provider]  Multiple Vitamins-Minerals (ZINC PO) Take by mouth.    [provider]  Vitamin A 2400 MCG (8000 UT) CAPS Take 1 capsule by mouth daily.    [provider]    Family History Family History  Problem Relation Age of Onset   Lung cancer Father 46   Dementia Mother    Cancer Paternal Grandmother        unknown type   Lung cancer Paternal Grandfather     Social  History Social History   Tobacco Use   Smoking status: Never   Smokeless tobacco: Never  Vaping Use   Vaping status: Never Used  Substance Use Topics   Alcohol use: Not Currently    Comment: 1-2 per year   Drug use: No     Allergies   Patient has no known allergies.   Review of Systems Review of Systems  Constitutional:  Negative for chills and fever.  Respiratory:  Negative for cough and shortness of breath.   Cardiovascular:  Negative for chest pain and palpitations.  Skin:  Positive for rash. Negative for color change.     Physical Exam Triage Vital Signs ED Triage Vitals  Encounter Vitals Group     BP 09/11/23 0853 116/77     Systolic BP Percentile --      Diastolic BP Percentile --      Pulse Rate 09/11/23 0853 74     Resp 09/11/23 0853 18     Temp 09/11/23 0853 98 F (36.7 C)     Temp src --      SpO2 09/11/23 0853 98 %     Weight --  Height --      Head Circumference --      Peak Flow --      Pain Score 09/11/23 0855 2     Pain Loc --      Pain Education --      Exclude from Growth Chart --    No data found.  Updated Vital Signs BP 116/77   Pulse 74   Temp 98 F (36.7 C)   Resp 18   LMP 10/31/2014   SpO2 98%   Visual Acuity Right Eye Distance:   Left Eye Distance:   Bilateral Distance:    Right Eye Near:   Left Eye Near:    Bilateral Near:     Physical Exam Constitutional:      General: She is not in acute distress. HENT:     Mouth/Throat:     Mouth: Mucous membranes are moist.  Cardiovascular:     Rate and Rhythm: Normal rate and regular rhythm.  Pulmonary:     Effort: Pulmonary effort is normal. No respiratory distress.  Skin:    General: Skin is warm and dry.     Findings: Rash present.     Comments: Clustered vesicular rash on left upper back along dermatome line.  Neurological:     Mental Status: She is alert.      UC Treatments / Results  Labs (all labs ordered are listed, but only abnormal results are  displayed) Labs Reviewed - No data to display  EKG   Radiology No results found.  Procedures Procedures (including critical care time)  Medications Ordered in UC Medications - No data to display  Initial Impression / Assessment and Plan / UC Course  I have reviewed the triage vital signs and the nursing notes.  Pertinent labs & imaging results that were available during my care of the patient were reviewed by me and considered in my medical decision making (see chart for details).    Herpes zoster.  Treating with Valtrex .  Tylenol  or ibuprofen as needed for discomfort.  Education provided on shingles.  Instructed patient to follow up with her PCP if her symptoms are not improving.  She agrees to plan of care.   Final Clinical Impressions(s) / UC Diagnoses   Final diagnoses:  Herpes zoster without complication     Discharge Instructions      Take the Valtrex  as directed.  Follow-up with your primary care provider if your symptoms are not improving.      ED Prescriptions     Medication Sig Dispense Auth. Provider   valACYclovir  (VALTREX ) 1000 MG tablet Take 1 tablet (1,000 mg total) by mouth 3 (three) times daily. 21 tablet Corlis Burnard DEL, NP      PDMP not reviewed this encounter.   Corlis Burnard DEL, NP 09/11/23 [phone]

## 2024-01-24 ENCOUNTER — Encounter: Payer: Self-pay | Admitting: Emergency Medicine

## 2024-01-24 ENCOUNTER — Emergency Department
Admission: EM | Admit: 2024-01-24 | Discharge: 2024-01-24 | Disposition: A | Discharge status: Home / Self Care | Payer: Self-pay | Attending: Emergency Medicine | Admitting: Emergency Medicine

## 2024-01-24 ENCOUNTER — Other Ambulatory Visit: Payer: Self-pay

## 2024-01-24 DIAGNOSIS — W57XXXA Bitten or stung by nonvenomous insect and other nonvenomous arthropods, initial encounter: Secondary | ICD-10-CM | POA: Insufficient documentation

## 2024-01-24 DIAGNOSIS — S00262A Insect bite (nonvenomous) of left eyelid and periocular area, initial encounter: Secondary | ICD-10-CM | POA: Diagnosis present

## 2024-01-24 DIAGNOSIS — Y92009 Unspecified place in unspecified non-institutional (private) residence as the place of occurrence of the external cause: Secondary | ICD-10-CM | POA: Diagnosis not present

## 2024-01-24 MED ORDER — DIPHENHYDRAMINE HCL 25 MG PO CAPS
50.0000 mg | ORAL_CAPSULE | Freq: Once | ORAL | Status: AC
  Administered 2024-01-24: 50 mg via ORAL
  Filled 2024-01-24: qty 2

## 2024-01-24 MED ORDER — KETOROLAC TROMETHAMINE 30 MG/ML IJ SOLN
30.0000 mg | Freq: Once | INTRAMUSCULAR | Status: AC
  Administered 2024-01-24: 30 mg via INTRAMUSCULAR
  Filled 2024-01-24: qty 1

## 2024-01-24 MED ORDER — ACETAMINOPHEN 500 MG PO TABS
1000.0000 mg | ORAL_TABLET | Freq: Once | ORAL | Status: AC
  Administered 2024-01-24: 1000 mg via ORAL
  Filled 2024-01-24: qty 2

## 2024-01-24 NOTE — ED Provider Notes (Signed)
 [facility] Provider Note    Event Date/Time   First MD Initiated Contact with Patient 01/24/24 1527     (approximate)   History   Insect Bite   HPI  [name] is a 58 y.o. female who presents to the ED for evaluation of Insect Bite   I review a routine PCP visit from 2021, no more recent visits noted.  No significant medical history.  Patient presents to the ED alongside her husband after an insect bite to her left temple that occurred just prior to arrival.  Was at home, outside, an insect stung her, uncertain exactly what it was.  Reports a single sting.  Went inside to take a look at it in your when she developed tightness to her right sided neck and shoulder, come around her bilateral rib cages and felt tingling going down her entire right side of her body.  All the subsequent symptoms have improved and resolved, she reports feeling somewhat silly coming into the ED but it was scary at the time.  No history of cardiac disease.  No syncope, falls or injuries.  Has ice over her insect bite and reports feeling better, but still sore at the bite location.   Physical Exam   Triage Vital Signs: ED Triage Vitals  Encounter Vitals Group     BP 01/24/24 1524 (!) 156/94     Systolic BP Percentile --      Diastolic BP Percentile --      Pulse Rate 01/24/24 1524 66     Resp 01/24/24 1524 18     Temp 01/24/24 1524 98 F (36.7 C)     Temp src --      SpO2 01/24/24 1524 98 %     Weight 01/24/24 1522 150 lb (68 kg)     Height 01/24/24 1522 5\' 5"  (1.651 m)     Head Circumference --      Peak Flow --      Pain Score 01/24/24 1521 4     Pain Loc --      Pain Education --      Exclude from Growth Chart --     Most recent vital signs: Vitals:   01/24/24 1524  BP: (!) 156/94  Pulse: 66  Resp: 18  Temp: 98 F (36.7 C)  SpO2: 98%    General: Awake, no distress.  CV:  Good peripheral perfusion.  Resp:  Normal effort.  Abd:  No distention.   MSK:  No deformity noted.  Some mild tenderness to the right trapezius muscle. Neuro:  No focal deficits appreciated. Cranial nerves II through XII intact 5/5 strength and sensation in all 4 extremities Other:  Singular insect bite to the left temple without signs of proptosis, EOM entrapment or globe injury.   ED Results / Procedures / Treatments   Labs (all labs ordered are listed, but only abnormal results are displayed) Labs Reviewed - No data to display  EKG Sinus rhythm with a rate of 63 bpm.  Normal axis and intervals.  No other signs of acute ischemia.  RADIOLOGY   Official radiology report(s): No results found.  PROCEDURES and INTERVENTIONS:  Procedures  Medications  ketorolac (TORADOL) 30 MG/ML injection 30 mg (30 mg Intramuscular Given 01/24/24 1611)  acetaminophen (TYLENOL) tablet 1,000 mg (1,000 mg Oral Given 01/24/24 1610)  diphenhydrAMINE (BENADRYL) capsule 50 mg (50 mg Oral Given 01/24/24 1611)     IMPRESSION / MDM / ASSESSMENT AND PLAN /  ED COURSE  I reviewed the triage vital signs and the nursing notes.  Differential diagnosis includes, but is not limited to, ACS or heart attack, muscular spasm, anaphylaxis, acute stress reaction or panic attack  {Patient presents with symptoms of an acute illness or injury that is potentially life-threatening.  Patient presents with right-sided tingling, chest discomfort and localized pain after an insect bite to her face.  Majority of symptoms have resolved and she looks well without signs of anaphylaxis.  No history of cardiac disease but we will obtain a twelve-lead EKG.  Discussed plan of care with patient and husband, will abstain from serum workup, extremely unlikely to be an NSTEMI or more severe pathology to require serum workup.  Will provide antihistamines, anti-inflammatories and reassess.  Clinical Course as of 01/24/24 1642  Sat Jan 24, 2024  1640 Reassessed.  Feeling well.  Discussed reassuring EKG.  No  recurrence of any worsening symptoms.  We discussed management at home and ED return precautions.  She is eager to go.  Suitable for outpatient management. [DS]    Clinical Course User Index [DS] Arline Bennett, MD     FINAL CLINICAL IMPRESSION(S) / ED DIAGNOSES   Final diagnoses:  Insect bite of left periocular area, initial encounter     Rx / DC Orders   ED Discharge Orders     None        Note:  This document was prepared using Dragon voice recognition software and may include unintentional dictation errors.   Arline Bennett, MD 01/24/24 [phone]

## 2024-01-24 NOTE — ED Triage Notes (Signed)
 Pt via POV from home. Pt was stung near the L eyebrow approx 5 min PTA. State she has pain all down the R side after that. Denies any throat swelling or NV. Pt very anxious on arrival. VSS. Denies allergy to bees. Pt is A&Ox4 and NAD

## 2024-01-24 NOTE — Discharge Instructions (Addendum)
 Please take Tylenol and ibuprofen/Advil for your pain.  It is safe to take them together, or to alternate them every few hours.  Take up to 1000mg  of Tylenol at a time, up to 4 times per day.  Do not take more than 4000 mg of Tylenol in 24 hours.  For ibuprofen, take 400-600 mg, 3 - 4 times per day.  Benadryl, 1-2 tablets per dose (25 to 50 mg)  Keep area clean, washing with soap and water to help prevent infection  Return to the ED with any worsening symptoms
